# Patient Record
Sex: Female | Born: 1980 | Race: White | Hispanic: No | Smoking: Never smoker
Health system: Southern US, Community
[De-identification: ages and names within clinical notes are randomized; demographics above are authoritative.]

## PROBLEM LIST (undated history)

## (undated) DIAGNOSIS — T7840XA Allergy, unspecified, initial encounter: Secondary | ICD-10-CM

## (undated) DIAGNOSIS — E282 Polycystic ovarian syndrome: Secondary | ICD-10-CM

## (undated) DIAGNOSIS — D649 Anemia, unspecified: Secondary | ICD-10-CM

## (undated) DIAGNOSIS — E119 Type 2 diabetes mellitus without complications: Secondary | ICD-10-CM

## (undated) DIAGNOSIS — F329 Major depressive disorder, single episode, unspecified: Secondary | ICD-10-CM

## (undated) DIAGNOSIS — R011 Cardiac murmur, unspecified: Secondary | ICD-10-CM

## (undated) DIAGNOSIS — F32A Depression, unspecified: Secondary | ICD-10-CM

## (undated) DIAGNOSIS — F419 Anxiety disorder, unspecified: Secondary | ICD-10-CM

## (undated) HISTORY — DX: Cardiac murmur, unspecified: R01.1

## (undated) HISTORY — DX: Polycystic ovarian syndrome: E28.2

## (undated) HISTORY — PX: CERVIX REMOVAL: SHX592

## (undated) HISTORY — DX: Allergy, unspecified, initial encounter: T78.40XA

## (undated) HISTORY — PX: ORIF FOREARM FRACTURE: SHX2124

## (undated) HISTORY — PX: TOTAL ABDOMINAL HYSTERECTOMY W/ BILATERAL SALPINGOOPHORECTOMY: SHX83

## (undated) HISTORY — DX: Anemia, unspecified: D64.9

## (undated) HISTORY — PX: ABDOMINAL HYSTERECTOMY: SHX81

## (undated) HISTORY — PX: TONSILLECTOMY: SUR1361

## (undated) HISTORY — PX: FRACTURE SURGERY: SHX138

---

## 2003-03-23 ENCOUNTER — Inpatient Hospital Stay (HOSPITAL_COMMUNITY): Admission: AD | Admit: 2003-03-23 | Discharge: 2003-03-23 | Payer: Self-pay | Admitting: Obstetrics and Gynecology

## 2003-04-03 ENCOUNTER — Inpatient Hospital Stay (HOSPITAL_COMMUNITY): Admission: AD | Admit: 2003-04-03 | Discharge: 2003-04-06 | Payer: Self-pay | Admitting: Obstetrics and Gynecology

## 2003-04-03 ENCOUNTER — Encounter (INDEPENDENT_AMBULATORY_CARE_PROVIDER_SITE_OTHER): Payer: Self-pay | Admitting: *Deleted

## 2003-05-15 ENCOUNTER — Other Ambulatory Visit: Admission: RE | Admit: 2003-05-15 | Discharge: 2003-05-15 | Payer: Self-pay | Admitting: Obstetrics and Gynecology

## 2004-06-14 ENCOUNTER — Other Ambulatory Visit: Admission: RE | Admit: 2004-06-14 | Discharge: 2004-06-14 | Payer: Self-pay | Admitting: Obstetrics and Gynecology

## 2005-01-14 ENCOUNTER — Inpatient Hospital Stay: Payer: Self-pay | Admitting: Psychiatry

## 2005-06-02 ENCOUNTER — Other Ambulatory Visit: Admission: RE | Admit: 2005-06-02 | Discharge: 2005-06-02 | Payer: Self-pay | Admitting: Obstetrics and Gynecology

## 2007-01-03 ENCOUNTER — Emergency Department (HOSPITAL_COMMUNITY): Admission: EM | Admit: 2007-01-03 | Discharge: 2007-01-03 | Payer: Self-pay | Admitting: Emergency Medicine

## 2010-04-06 ENCOUNTER — Emergency Department (HOSPITAL_COMMUNITY): Admission: EM | Admit: 2010-04-06 | Discharge: 2010-04-06 | Payer: Self-pay | Admitting: Emergency Medicine

## 2010-12-04 LAB — COMPREHENSIVE METABOLIC PANEL
AST: 17 U/L (ref 0–37)
Alkaline Phosphatase: 51 U/L (ref 39–117)
Calcium: 9.5 mg/dL (ref 8.4–10.5)
GFR calc Af Amer: >60 mL/min (ref >60)
GFR calc non Af Amer: >60 mL/min (ref >60)
Glucose, Bld: 90 mg/dL (ref 70–99)
Potassium: 4.2 mEq/L (ref 3.5–5.1)
Total Bilirubin: 1 mg/dL (ref 0.3–1.2)
Total Protein: 7.6 g/dL (ref 6.0–8.3)

## 2010-12-04 LAB — CBC
MCHC: 34.5 g/dL (ref 30.0–36.0)
MCV: 93.1 fL (ref 78.0–100.0)
Platelets: 304 10*3/uL (ref 150–400)
WBC: 6.8 10*3/uL (ref 4.0–10.5)

## 2010-12-04 LAB — WET PREP, GENITAL
Trich, Wet Prep: NONE SEEN
Yeast Wet Prep HPF POC: NONE SEEN

## 2010-12-04 LAB — GLUCOSE, CAPILLARY: Glucose-Capillary: 89 mg/dL (ref 70–99)

## 2010-12-04 LAB — URINALYSIS, ROUTINE W REFLEX MICROSCOPIC
Glucose, UA: NEGATIVE mg/dL
Hgb urine dipstick: NEGATIVE
Ketones, ur: NEGATIVE mg/dL
Nitrite: NEGATIVE

## 2010-12-04 LAB — DIFFERENTIAL
Eosinophils Relative: 2 % (ref 0–5)
Lymphocytes Relative: 39 % (ref 12–46)
Monocytes Absolute: 0.7 10*3/uL (ref 0.1–1.0)
Neutro Abs: 3.3 10*3/uL (ref 1.7–7.7)
Neutrophils Relative %: 49 % (ref 43–77)

## 2010-12-15 ENCOUNTER — Encounter: Payer: PRIVATE HEALTH INSURANCE | Attending: Obstetrics and Gynecology

## 2010-12-15 DIAGNOSIS — O99810 Abnormal glucose complicating pregnancy: Secondary | ICD-10-CM | POA: Insufficient documentation

## 2010-12-15 DIAGNOSIS — Z713 Dietary counseling and surveillance: Secondary | ICD-10-CM | POA: Insufficient documentation

## 2011-02-04 NOTE — Op Note (Signed)
NAME:  PATTIANN, SOLANKI                          ACCOUNT NO.:  [id number]   MEDICAL RECORD NO.:  [id number]                   PATIENT TYPE:  INP   LOCATION:  9108                                 FACILITY:  [facility]   PHYSICIAN:  Guy Sandifer. Arleta Creek, M.D.           DATE OF BIRTH:  05-Nov-1980   DATE OF PROCEDURE:  04/03/2003  DATE OF DISCHARGE:                                 OPERATIVE REPORT   PREOPERATIVE DIAGNOSES:  1. Intrauterine pregnancy at 58 2/7 weeks' estimated gestational age.  2. Arrest of descent.   POSTOPERATIVE DIAGNOSES:  1. Intrauterine pregnancy at 35 2/7 weeks' estimated gestational age.  2. Arrest of descent.  3. Persistent direct occiput posterior.   INDICATIONS AND CONSENT:  This patient is a 30 year old married white  female, G1, P0, with an EDC of April 08, 2003, whose pregnancy has been  complicated by mild pregnancy-induced hypertension.  Group B beta strep  culture is negative.  She is admitted for induction of labor.  Blood  pressures 120s/70s, pH laboratories are within normal limits.  Cervix is 2-3  cm, 50% effaced, and -3 station.  Pitocin augmentation is started followed  by artificial rupture of membranes for clear fluid.  The patient progresses  to complete and pushing and pushes for 1 hour and 10 minutes of good effort.  Vertex remains at the -1 station with caput formation.  There are deep  variables with pushing.  Options are discussed and cesarean section is  pursued.  Potential risks and complications are discussed including, but not  limited to, infection, bowel, bladder, ureteral damage, bleeding requiring  transfusion of blood products with possible transfusion reaction, HIV and  hepatitis acquisition, DVT, PE, pneumonia.  All questions are answered and  consent is signed and on the chart.   PROCEDURE:  The patient was taken to the operating room where her epidural  is augmented.  She is prepped and draped in a sterile fashion.  A Foley  catheter is already in place.  Testing reveals patient is still experiencing  some pain on the left side.  The epidural is augmented.  She is also  injected along the line of the incision with 10 mL of 1% plain Xylocaine.  A  Pfannenstiel incision is made and dissection is carried out in layers to the  peritoneum.  The peritoneum is incised and extended superiorly and  inferiorly.  The vesicouterine peritoneum is taken down cephalolaterally.  The bladder flap is developed and a bladder blade is placed.  The uterus is  incised in a low transverse manner and the uterine cavity is entered bluntly  with a hemostat.  The uterine incision is then extended cephalolaterally  with the fingers.  The vertex is in direct occiput posterior.  In order to  elevate the vertex, a gloved hand from below is used to elevate the head  allowing me to extract the vertex from the  pelvis.  The baby is then  delivered without difficulty.  Oropharynx and nasopharynx are suctioned and  good crying tone is noted.  Cord is clamped and cut and the infant is handed  to the awaiting pediatrics team.  The placenta is manually delivered and  sent to pathology.  The uterine cavity is normal in contour and clean.  The  uterus is closed in a running locking fashion with 0 Monocryl suture.  There  is an extension on each side of the incision approximately 2 cm.  Good  hemostasis is noted.  Tubes and ovaries are normal.  Anterior peritoneum is  closed in running fashion with 0  Monocryl suture which is also used to reapproximate the pyramidalis muscle  in the midline.  The anterior rectus fascia is closed in a running fashion  with 0 PDS suture and the skin is closed with clips.  All sponge,  instrument, and needle counts are correct and the patient is transferred to  the recovery room in stable condition.                                               Guy Sandifer Arleta Creek, M.D.    JET/MEDQ  D:  04/03/2003  T:  04/04/2003   Job:  [id number]

## 2011-02-04 NOTE — Discharge Summary (Signed)
NAME:  Crystal Mccoy, Crystal Mccoy                          ACCOUNT NO.:  [id number]   MEDICAL RECORD NO.:  [id number]                   PATIENT TYPE:  INP   LOCATION:  9108                                 FACILITY:  [facility]   PHYSICIAN:  Dineen Kid. Rana Snare, M.D.                 DATE OF BIRTH:  [birth year]   DATE OF ADMISSION:  04/03/2003  DATE OF DISCHARGE:  04/06/2003                                 DISCHARGE SUMMARY   ADMITTING DIAGNOSES:  1. Intrauterine pregnancy at 65 and two-sevenths weeks estimated gestational     age.  2. Induction of labor due to mild pregnancy-induced hypertension.   DISCHARGE DIAGNOSES:  1. Status post low transverse cesarean section secondary to arrest of     descent and persistent direct occiput posterior.  2. Viable female infant.   PROCEDURE:  Primary low transverse cesarean section.   REASON FOR ADMISSION:  Please see written H&P.   HOSPITAL COURSE:  The patient was a 30 year old primigravida that was  admitted to [facility] for an induction of labor due to  mild pregnancy-induced hypertension.  Blood pressures were 120s over 70s  with PIH labs within normal limits.  Cervix on admission was 2-3 cm dilated,  50% effaced, and vertex at a -3 station.  Pitocin augmentation was started  followed by artificial rupture of membranes.  Fluid was clear.  The patient  did progress to complete dilation and continued to push for approximately  one hour 10 minutes with good effort.  Vertex did remain at a -1 station and  was noted to have caput formation.  There were also deep variable  decelerations noted with pushing.  Despite adequate efforts of pushing and  vertex noted with molding, decision was made to proceed with a primary low  transverse cesarean section.  The patient was then transferred to the  operating room where her epidural was augmented to an adequate surgical  level.  A low transverse incision was made with the delivery of a viable  female  infant weighing 5 pounds 12 ounces with Apgars of 8 at one minute and  9 at five minutes.  Umbilical cord pH was 7.25.  The patient tolerated the  procedure well and was taken to the recovery room in stable condition.  On  postoperative day #1 vital signs were stable, blood pressure 120/70, with  deep tendon reflexes 1-2+ without clonus.  Abdomen was soft with good return  of bowel function and fundus was firm and nontender.  Abdominal dressing was  noted to be clean, dry, and intact.  Foley was noted to be to straight drain  with adequate output.  Labs revealed hemoglobin of 10.2; platelet count of  282,000; wbc count of 13.7.  Liver function tests within normal limits.  On  postoperative day #2 the patient was without complaint.  Vital signs were  stable.  Fundus was firm and  nontender.  Abdominal dressing was removed  revealing an incision that was clean, dry, and intact.  The patient was  tolerating a regular diet without complaints of nausea and vomiting.  She  was ambulating well.  On postoperative day #3 the patient was without  complaint.  She denied headache or visual disturbance.  Vital signs were  stable.  Fundus was firm and nontender.  Incision was clean, dry, and  intact.  Staples were removed and the patient was discharged home.   CONDITION ON DISCHARGE:  Good.   DIET:  Regular as tolerated.   ACTIVITY:  No heavy lifting, no driving x2 weeks, no vaginal entry.   FOLLOW-UP:  The patient is to follow up in the office in one to two weeks  for incision check.  She is to call for temperature greater than 100  degrees, persistent nausea and vomiting, heavy vaginal bleeding, and/or  redness or drainage from the incisional site.   DISCHARGE MEDICATIONS:  1. Percocet 5/325 #30 one p.o. q.4-6h. p.r.n.  2. Motrin 600 mg q.6h. p.r.n.  3. Prenatal vitamins one p.o. daily.  4. Colace one p.o. daily p.r.n.     Julio Sicks, N.P.                        Dineen Kid Rana Snare, M.D.     CC/MEDQ  D:  05/05/2003  T:  05/05/2003  Job:  [id number]

## 2011-02-13 ENCOUNTER — Inpatient Hospital Stay (HOSPITAL_COMMUNITY)
Admission: RE | Admit: 2011-02-13 | Discharge: 2011-02-16 | DRG: 766 | Disposition: A | Discharge status: Home / Self Care | Payer: PRIVATE HEALTH INSURANCE | Source: Ambulatory Visit | Attending: Obstetrics and Gynecology | Admitting: Obstetrics and Gynecology

## 2011-02-13 DIAGNOSIS — O34219 Maternal care for unspecified type scar from previous cesarean delivery: Principal | ICD-10-CM | POA: Diagnosis present

## 2011-02-13 DIAGNOSIS — Z302 Encounter for sterilization: Secondary | ICD-10-CM

## 2011-02-14 ENCOUNTER — Other Ambulatory Visit: Payer: Self-pay | Admitting: Obstetrics and Gynecology

## 2011-02-14 LAB — CBC
HCT: 42.7 % (ref 36.0–46.0)
MCHC: 34.2 g/dL (ref 30.0–36.0)

## 2011-02-15 LAB — CBC
Hemoglobin: 11.5 g/dL — ABNORMAL LOW (ref 12.0–15.0)
MCH: 29.9 pg (ref 26.0–34.0)
MCV: 87 fL (ref 78.0–100.0)
Platelets: 233 10*3/uL (ref 150–400)
RBC: 3.85 MIL/uL — ABNORMAL LOW (ref 3.87–5.11)

## 2011-02-21 NOTE — Discharge Summary (Addendum)
  NAMEINIKA, Crystal Mccoy                ACCOUNT NO.:  [id number]  MEDICAL RECORD NO.:  [id number]  LOCATION:                                 FACILITY:  PHYSICIAN:  Duke Salvia. Marcelle Overlie, M.D.DATE OF BIRTH:  [birth year]  DATE OF ADMISSION:  02/14/2011 DATE OF DISCHARGE:  02/16/2011                              DISCHARGE SUMMARY   ADMITTING DIAGNOSES: 1. Intrauterine pregnancy at 42 weeks' estimated gestational age. 2. Spontaneous onset of labor. 3. Previous cesarean section, desires repeat.  DISCHARGE DIAGNOSES: 1. Status post low transverse cesarean section 2. Viable female infant.  PROCEDURE:  Repeat low transverse cesarean section.  REASON FOR ADMISSION:  Please see written H&P.  HOSPITAL COURSE:  The patient is a 30 year old gravida 2, para 1 that was admitted to [facility] at 37 weeks' estimated gestational age with spontaneous onset of labor.  The patient had had a previous cesarean delivery and desired repeat.  On admission, vital signs were stable.  Contractions of approximately every 2-3 minutes. Fetal heart tones were in the 150s with acceleration.  Cervix was examined and found to be 5 cm dilated.  The patient was then consented for cesarean delivery and was taken to the operating room where spinal anesthesia was administered without difficulty.  A low transverse incision was made with delivery of a viable female infant weighing 5 pounds 4 ounces, Apgar's of 9 at 1 minute and 9 at 5 minutes.  Arterial cord pH of 7.29.  Due to multiparity, the patient also had requested bilateral tubal ligation which was performed without difficulty.  The patient tolerated the procedure well and taken to the recovery room in stable condition.  On postoperative day #1, the patient was without complaint.  Vital signs were stable.  Fundus firm and nontender. Abdominal dressing was noted to be clean, dry, and intact.  On postoperative day #2, the patient desired early discharge.   Vital signs were stable.  Abdomen soft.  Fundus firm and nontender.  Incision was clean, dry, and intact.  Discharge instructions were reviewed and the patient was later discharged home.  CONDITION ON DISCHARGE:  Stable.  DIET:  Regular as tolerated.  ACTIVITY:  No heavy lifting, no driving x2 weeks, no vaginal entry.  FOLLOWUP:  The patient to follow up in the office in 2 days for staple removal.  She is to call for temperature greater than 100 degrees, persistent nausea, vomiting, heavy vaginal bleeding, and/or redness, drainage from incisional site.  DISCHARGE MEDICATIONS: 1. Tylox #30, 1 p.o. every 4-6 hours p.r.n. 2. Motrin 600 mg every 6 hours. 3. Prenatal vitamins 1 p.o. daily.     Julio Sicks, N.P.   ______________________________ Duke Salvia. Marcelle Overlie, M.D.    CC/MEDQ  D:  02/16/2011  T:  02/16/2011  Job:  [id number]  Electronically Signed by Julio Sicks N.P. on 03/11/2011 08:33:55 AM Electronically Signed by Richarda Overlie M.D. on 03/15/2011 01:40:49 PM

## 2011-02-22 ENCOUNTER — Other Ambulatory Visit (HOSPITAL_COMMUNITY): Payer: PRIVATE HEALTH INSURANCE

## 2011-02-23 NOTE — Op Note (Signed)
Crystal Mccoy, Crystal Mccoy                ACCOUNT NO.:  [id number]  MEDICAL RECORD NO.:  [id number]           PATIENT TYPE:  LOCATION:                                 FACILITY:  PHYSICIAN:  Zelphia Cairo, MD    DATE OF BIRTH:  May 09, 1981  DATE OF PROCEDURE:  02/14/2011 DATE OF DISCHARGE:                              OPERATIVE REPORT   PREOPERATIVE DIAGNOSES: 1. Intrauterine pregnancy at 37 weeks. 2. Prior cesarean section, desires repeat. 3. Labor. 4. Desires permanent sterilization.  PROCEDURES: 1. Repeat low transverse cesarean delivery. 2. Bilateral partial salpingectomy.  SURGEON:  Zelphia Cairo, MD  ANESTHESIA:  Spinal.  FINDINGS:  Viable female infant with Apgar's of 9 and 9, pH 7.29, weight 5 pounds 4 ounces, placenta to path.  SPECIMEN:  Placenta to Pathology.  URINE OUTPUT:  200 mL, clear.  ESTIMATED BLOOD LOSS:  500 mL.  COMPLICATIONS:  None.  CONDITION:  Stable to recovery room.  PROCEDURE:  [name] was taken to the operating room where spinal anesthesia was found to be adequate.  She was placed in the supine position with a left tilt, prepped and draped in sterile fashion and a Foley catheter was inserted sterilely.  Pfannenstiel skin incision was made with a scalpel and extended laterally using curved Mayo scissors. Kocher clamps were used to grasp the superior and inferior portion of the fascia.  The fascia was tented upward and underlying rectus muscles were dissected off using sharp and blunt dissection and the Bovie. Peritoneum was then identified and tented upwards.  This was entered sharply with Metzenbaum scissors and extended superiorly and inferiorly with good visualization of the bladder.  Bladder blade was then inserted and the vesicouterine peritoneum was dissected off the lower uterine segment.  Bladder blade was repositioned.  Uterine incision was made with a scalpel and extended bluntly using my fingers.  Membranes were ruptured and the  fetus was delivered using fundal pressure.  The mouth and nose were suctioned as the cord was clamped and cut and infant was taken to the awaiting pediatric staff.  Placenta was then manually removed from the uterus.  The uterus was cleared of all clots and debris using a dry lap sponge.  The uterine incision was reapproximated using double layer closure of 0 chromic in a running locked fashion.  Once hemostasis was assured, our attention was turned to the fallopian tubes.  A Babcock clamp was used to grasp the left fallopian tube.  Mesosalpinx was transected below the Babcock clamp and a knuckle of fallopian tube was doubly ligated using plain gut suture.  Segment of fallopian tube was then transected and passed off to be sent to Pathology.  Hemostasis was assured and the procedure was repeated on the opposite fallopian tube.  Once bilateral salpingectomy had been performed, the uterine incision was reinspected and again found to be hemostatic.  The pelvis was then copiously irrigated with saline and the peritoneum was closed with 0 Monocryl.  The fascia was closed with a looped 0 PDS. Subcutaneous tissue was reapproximated with plain gut suture and the skin was closed with staples.  Sponge lap,  needle, and instrument counts were correct x2.     Zelphia Cairo, MD     GA/MEDQ  D:  02/14/2011  T:  02/14/2011  Job:  [id number]  Electronically Signed by Zelphia Cairo MD on 02/23/2011 07:41:39 PM

## 2012-10-22 ENCOUNTER — Emergency Department (HOSPITAL_COMMUNITY)
Admission: EM | Admit: 2012-10-22 | Discharge: 2012-10-22 | Disposition: A | Discharge status: Home / Self Care | Payer: PRIVATE HEALTH INSURANCE | Attending: Emergency Medicine | Admitting: Emergency Medicine

## 2012-10-22 ENCOUNTER — Encounter (HOSPITAL_COMMUNITY): Payer: Self-pay | Admitting: *Deleted

## 2012-10-22 DIAGNOSIS — R112 Nausea with vomiting, unspecified: Secondary | ICD-10-CM | POA: Insufficient documentation

## 2012-10-22 DIAGNOSIS — R109 Unspecified abdominal pain: Secondary | ICD-10-CM

## 2012-10-22 DIAGNOSIS — Z9889 Other specified postprocedural states: Secondary | ICD-10-CM | POA: Insufficient documentation

## 2012-10-22 DIAGNOSIS — R1013 Epigastric pain: Secondary | ICD-10-CM | POA: Insufficient documentation

## 2012-10-22 LAB — URINALYSIS, ROUTINE W REFLEX MICROSCOPIC
Glucose, UA: NEGATIVE mg/dL
Hgb urine dipstick: NEGATIVE
Ketones, ur: 15 mg/dL — AB
Nitrite: NEGATIVE
Urobilinogen, UA: 0.2 mg/dL (ref 0.0–1.0)
pH: 5.5 (ref 5.0–8.0)

## 2012-10-22 LAB — COMPREHENSIVE METABOLIC PANEL
ALT: 7 U/L (ref 0–35)
Albumin: 4.2 g/dL (ref 3.5–5.2)
Calcium: 9.5 mg/dL (ref 8.4–10.5)
Creatinine, Ser: 0.55 mg/dL (ref 0.50–1.10)
GFR calc Af Amer: >90 mL/min (ref >90)
GFR calc non Af Amer: >90 mL/min (ref >90)
Glucose, Bld: 153 mg/dL — ABNORMAL HIGH (ref 70–99)
Potassium: 3.9 mEq/L (ref 3.5–5.1)
Sodium: 136 mEq/L (ref 135–145)
Total Bilirubin: 0.7 mg/dL (ref 0.3–1.2)
Total Protein: 8.3 g/dL (ref 6.0–8.3)

## 2012-10-22 LAB — URINE MICROSCOPIC-ADD ON

## 2012-10-22 LAB — CBC
HCT: 42.1 % (ref 36.0–46.0)
WBC: 11.1 10*3/uL — ABNORMAL HIGH (ref 4.0–10.5)

## 2012-10-22 MED ORDER — ONDANSETRON HCL 4 MG/2ML IJ SOLN
4.0000 mg | Freq: Once | INTRAMUSCULAR | Status: AC
  Administered 2012-10-22: 4 mg via INTRAVENOUS
  Filled 2012-10-22: qty 2

## 2012-10-22 MED ORDER — PROMETHAZINE HCL 25 MG PO TABS: 12.5000 mg | ORAL_TABLET | Freq: Four times a day (QID) | ORAL | Status: DC | PRN

## 2012-10-22 MED ORDER — PROMETHAZINE HCL 25 MG/ML IJ SOLN
25.0000 mg | Freq: Once | INTRAMUSCULAR | Status: AC
  Administered 2012-10-22: 25 mg via INTRAVENOUS
  Filled 2012-10-22: qty 1

## 2012-10-22 MED ORDER — SODIUM CHLORIDE 0.9 % IV SOLN
Freq: Once | INTRAVENOUS | Status: AC
  Administered 2012-10-22: 06:00:00 via INTRAVENOUS

## 2012-10-22 NOTE — ED Notes (Signed)
Pt reporting epigastric abdominal pain starting about 11 pm.  Also reporting nausea and vomiting beginning at about 1.  Pt reports taking phenergan and zofran at home with no relief.  Also complaining of rash on face that also began this evening.

## 2012-10-22 NOTE — ED Provider Notes (Signed)
History     CSN: [id number]  Arrival date & time 10/22/12  0503   First MD Initiated Contact with Patient 10/22/12 [phone]      Chief Complaint  Patient presents with  . Abdominal Pain  . Nausea  . Emesis    (Consider location/radiation/quality/duration/timing/severity/associated sxs/prior treatment) Patient is a 32 y.o. female presenting with abdominal pain and vomiting.  Abdominal Pain The primary symptoms of the illness include abdominal pain, nausea and vomiting. The primary symptoms of the illness do not include fever or shortness of breath.  Symptoms associated with the illness do not include back pain.  Emesis  Associated symptoms include abdominal pain. Pertinent negatives include no cough, no fever and no headaches.  YOVANNA COGAN is a 32 y.o. female who presents to the Emergency Department complaining of abdominal pain associated with vomiting. She woke originally around 2300 and took phenergan. Woke again at 0100 with vomiting and abdominal pain. Denies fever, chills. Has abdominal pain. Vomiting.  History reviewed. No pertinent past medical history.  Past Surgical History  Procedure Date  . Cesarean section     History reviewed. No pertinent family history.  History  Substance Use Topics  . Smoking status: Not on file  . Smokeless tobacco: Not on file  . Alcohol Use: No    OB History    Grav Para Term Preterm Abortions TAB SAB Ect Mult Living                  Review of Systems  Constitutional: Negative for fever.       10 Systems reviewed and are negative for acute change except as noted in the HPI.  HENT: Negative for congestion.   Eyes: Negative for discharge and redness.  Respiratory: Negative for cough and shortness of breath.   Cardiovascular: Negative for chest pain.  Gastrointestinal: Positive for nausea, vomiting and abdominal pain.  Musculoskeletal: Negative for back pain.  Skin: Negative for rash.  Neurological: Negative for syncope,  numbness and headaches.  Psychiatric/Behavioral:       No behavior change.    Allergies  Omniflox and Penicillins  Home Medications   Current Outpatient Rx  Name  Route  Sig  Dispense  Refill  . ETONOGESTREL-ETHINYL ESTRADIOL 0.12-0.015 MG/24HR VA RING   Vaginal   Place 1 each vaginally every 28 (twenty-eight) days. Insert vaginally and leave in place for 3 consecutive weeks, then remove for 1 week.           BP 150/88  Pulse 131  Temp 98.9 F (37.2 C) (Oral)  Resp 18  Ht 5\' 5"  (1.651 m)  Wt 210 lb (95.255 kg)  BMI 34.95 kg/m2  SpO2 97%  LMP 09/28/2012  Physical Exam  Nursing note and vitals reviewed. Constitutional:       Awake, alert, nontoxic appearance.  HENT:  Head: Atraumatic.  Eyes: Right eye exhibits no discharge. Left eye exhibits no discharge.  Neck: Neck supple.  Cardiovascular: Normal heart sounds.   Pulmonary/Chest: Effort normal and breath sounds normal. She exhibits no tenderness.  Abdominal: Soft. There is tenderness. There is no rebound.       vomiting  Musculoskeletal: She exhibits no tenderness.       Baseline ROM, no obvious new focal weakness.  Neurological:       Mental status and motor strength appears baseline for patient and situation.  Skin: No rash noted.  Psychiatric: She has a normal mood and affect.    ED Course  Procedures (  including critical care time)  Results for orders placed during the hospital encounter of 10/22/12  URINALYSIS, ROUTINE W REFLEX MICROSCOPIC      Component Value Range   Color, Urine YELLOW  YELLOW   APPearance CLEAR  CLEAR   Specific Gravity, Urine >1.030 (*) 1.005 - 1.030   pH 5.5  5.0 - 8.0   Glucose, UA NEGATIVE  NEGATIVE mg/dL   Hgb urine dipstick NEGATIVE  NEGATIVE   Bilirubin Urine NEGATIVE  NEGATIVE   Ketones, ur 15 (*) NEGATIVE mg/dL   Protein, ur TRACE (*) NEGATIVE mg/dL   Urobilinogen, UA 0.2  0.0 - 1.0 mg/dL   Nitrite NEGATIVE  NEGATIVE   Leukocytes, UA NEGATIVE  NEGATIVE  CBC       Component Value Range   WBC 11.1 (*) 4.0 - 10.5 K/uL   RBC 4.87  3.87 - 5.11 MIL/uL   Hemoglobin 14.9  12.0 - 15.0 g/dL   HCT 78.4  69.6 - 29.5 %   MCV 86.4  78.0 - 100.0 fL   MCH 30.6  26.0 - 34.0 pg   MCHC 35.4  30.0 - 36.0 g/dL   RDW 28.4  13.2 - 44.0 %   Platelets 312  150 - 400 K/uL  COMPREHENSIVE METABOLIC PANEL      Component Value Range   Sodium 136  135 - 145 mEq/L   Potassium 3.9  3.5 - 5.1 mEq/L   Chloride 103  96 - 112 mEq/L   CO2 18 (*) 19 - 32 mEq/L   Glucose, Bld 153 (*) 70 - 99 mg/dL   BUN 12  6 - 23 mg/dL   Creatinine, Ser 1.02  0.50 - 1.10 mg/dL   Calcium 9.5  8.4 - 72.5 mg/dL   Total Protein 8.3  6.0 - 8.3 g/dL   Albumin 4.2  3.5 - 5.2 g/dL   AST 17  0 - 37 U/L   ALT 7  0 - 35 U/L   Alkaline Phosphatase 56  39 - 117 U/L   Total Bilirubin 0.7  0.3 - 1.2 mg/dL   GFR calc non Af Amer >90  >90 mL/min   GFR calc Af Amer >90  >90 mL/min  URINE MICROSCOPIC-ADD ON      Component Value Range   Squamous Epithelial / LPF FEW (*) RARE   RBC / HPF 0-2  <3 RBC/hpf   Bacteria, UA FEW (*) RARE     MDM  Patient with abdominal pain, vomiting, nausea.Given zofran with some relief. No further vomiting. Continues to have abdominal pain. Given phenergan. Labs are unremarkable. Continued to have pain in the abdomen. Pt stable in ED with no significant deterioration in condition.The patient appears reasonably screened and/or stabilized for discharge and I doubt any other medical condition or other [facility] requiring further screening, evaluation, or treatment in the ED at this time prior to discharge. MDM Reviewed: nursing note and vitals Interpretation: labs           Nicoletta Dress. Colon Branch, MD 10/22/12 [phone]

## 2014-06-27 ENCOUNTER — Other Ambulatory Visit: Payer: Self-pay | Admitting: Obstetrics and Gynecology

## 2014-10-23 ENCOUNTER — Ambulatory Visit: Payer: Self-pay | Admitting: General Practice

## 2016-08-19 ENCOUNTER — Encounter (HOSPITAL_COMMUNITY): Payer: Self-pay

## 2016-08-19 ENCOUNTER — Emergency Department (HOSPITAL_COMMUNITY)
Admission: EM | Admit: 2016-08-19 | Discharge: 2016-08-19 | Disposition: A | Discharge status: Home / Self Care | Payer: BLUE CROSS/BLUE SHIELD | Attending: Emergency Medicine | Admitting: Emergency Medicine

## 2016-08-19 DIAGNOSIS — F410 Panic disorder [episodic paroxysmal anxiety] without agoraphobia: Secondary | ICD-10-CM

## 2016-08-19 DIAGNOSIS — Z91040 Latex allergy status: Secondary | ICD-10-CM | POA: Insufficient documentation

## 2016-08-19 DIAGNOSIS — F419 Anxiety disorder, unspecified: Secondary | ICD-10-CM | POA: Diagnosis not present

## 2016-08-19 MED ORDER — HYDROXYZINE HCL 25 MG PO TABS: 25.0000 mg | ORAL_TABLET | Freq: Three times a day (TID) | ORAL | 0 refills | Status: DC | PRN

## 2016-08-19 NOTE — ED Notes (Signed)
Pt is in stable condition upon d/c and ambulates from ED. 

## 2016-08-19 NOTE — ED Provider Notes (Signed)
[facility]-EMERGENCY DEPT Provider Note   CSN: [id number] Arrival date & time: 08/19/16  [id number]     History   Chief Complaint Chief Complaint  Patient presents with  . Anxiety    HPI Crystal Mccoy is a 35 y.o. female.  HPI Patient presents with episode of anxiety. States she was driving to work and then later found herself 30 miles away not sure where she was. Does have a history of anxiety and has had a stressor lice recently but states she has never had an episode this severe in the past. No headache. No chest pain or trouble breathing. She called her husband, from whom she is separated. She took an Atarax and is feeling somewhat better now. No numbness or weakness. Denies drug use. Denies possibly pregnancy. Denies suicidal or homicidal thoughts. She does have some she sees about her anxiety.   History reviewed. No pertinent past medical history.  There are no active problems to display for this patient.   Past Surgical History:  Procedure Laterality Date  . CESAREAN SECTION      OB History    No data available       Home Medications    Prior to Admission medications   Medication Sig Start Date End Date Taking? Authorizing Provider  CAMRESE 0.15-0.03 &0.01 MG tablet Take 1 tablet by mouth every evening. 06/27/16  Yes Historical Provider, MD  eszopiclone (LUNESTA) 2 MG TABS tablet Take 2 mg by mouth at bedtime. 08/18/16  Yes Historical Provider, MD  loratadine (CLARITIN) 10 MG tablet Take 10 mg by mouth daily.   Yes Historical Provider, MD  PARoxetine (PAXIL) 10 MG tablet Take 10 mg by mouth every evening. 06/27/16  Yes Historical Provider, MD  hydrOXYzine (ATARAX/VISTARIL) 25 MG tablet Take 1 tablet (25 mg total) by mouth every 8 (eight) hours as needed for anxiety. 08/19/16   Benjiman CoreNathan Kateena Degroote, MD  promethazine (PHENERGAN) 25 MG tablet Take 0.5 tablets (12.5 mg total) by mouth every 6 (six) hours as needed for nausea. Patient not taking: Reported on 08/19/2016 10/22/12 10/29/12   Annamarie Dawleyerry S Strand, MD    Family History No family history on file.  Social History Social History  Substance Use Topics  . Smoking status: Never Smoker  . Smokeless tobacco: Never Used  . Alcohol use No     Allergies   Omniflox [temafloxacin]; Latex; and Penicillins   Review of Systems Review of Systems  Constitutional: Negative for activity change and appetite change.  Eyes: Negative for pain.  Respiratory: Negative for chest tightness and shortness of breath.   Cardiovascular: Negative for chest pain and leg swelling.  Gastrointestinal: Negative for abdominal pain, diarrhea, nausea and vomiting.  Genitourinary: Negative for flank pain.  Musculoskeletal: Negative for back pain.  Skin: Negative for rash.  Neurological: Negative for speech difficulty, weakness, light-headedness, numbness and headaches.  Psychiatric/Behavioral: Negative for behavioral problems. The patient is nervous/anxious.      Physical Exam Updated Vital Signs BP 147/79   Pulse 70   Temp 97.9 F (36.6 C) (Oral)   Resp 16   Ht 5\' 6"  (1.676 m)   Wt 210 lb (95.3 kg)   LMP 08/19/2016   SpO2 97%   BMI 33.89 kg/m   Physical Exam  Constitutional: She is oriented to person, place, and time. She appears well-developed.  HENT:  Head: Atraumatic.  Eyes: EOM are normal.  Neck: Neck supple.  Cardiovascular: Normal rate.   Pulmonary/Chest: Effort normal.  Abdominal: Soft.  Musculoskeletal: She  exhibits no edema.  Neurological: She is alert and oriented to person, place, and time.  Skin: Skin is warm. Capillary refill takes less than 2 seconds.  Psychiatric:  Patient appears somewhat anxious. Awake and appropriate otherwise.     ED Treatments / Results  Labs (all labs ordered are listed, but only abnormal results are displayed) Labs Reviewed - No data to display  EKG  EKG Interpretation None       Radiology No results found.  Procedures Procedures (including critical care  time)  Medications Ordered in ED Medications - No data to display   Initial Impression / Assessment and Plan / ED Course  I have reviewed the triage vital signs and the nursing notes.  Pertinent labs & imaging results that were available during my care of the patient were reviewed by me and considered in my medical decision making (see chart for details).  Clinical Course     Patient with likely episode of anxiety. Feels better after her outpatient Atarax. Nonfocal exam. Has follow-up. Will discharge with more of her Atarax.  Final Clinical Impressions(s) / ED Diagnoses   Final diagnoses:  Anxiety attack    New Prescriptions New Prescriptions   HYDROXYZINE (ATARAX/VISTARIL) 25 MG TABLET    Take 1 tablet (25 mg total) by mouth every 8 (eight) hours as needed for anxiety.     Benjiman CoreNathan Kalandra Masters, MD 08/19/16 [phone]

## 2016-08-19 NOTE — ED Triage Notes (Signed)
Pt. Woke up this morning was getting ready for work , left the house and 30 minutes later called her husband and having a panic attack and did not know where she was at or how she got there.  She called her husband., and he went and picked her up and gave her a adderax.  Pt. Is alert and oriented X4 and pt. Feels very anxious.  She reports this has happened one other time. Pt. Denies any chest pain or sob.,  No neuro deficits noted.

## 2018-02-05 ENCOUNTER — Ambulatory Visit: Payer: Self-pay | Admitting: Family Medicine

## 2018-02-05 VITALS — BP 142/81 | HR 82 | Temp 97.9°F | Resp 18 | Ht 65.0 in | Wt 209.6 lb

## 2018-02-05 DIAGNOSIS — Z299 Encounter for prophylactic measures, unspecified: Secondary | ICD-10-CM

## 2018-02-05 NOTE — Progress Notes (Signed)
Subjective: Pre-nursing school exam  Patient presents to for a pre-nursing school physical exam with immunizations.  Patient reports that her only medical history is type 2 diabetes, PCOS, and anxiety and depression.  Patient reports she uses Victoza for type 2 diabetes, which is well controlled without any diabetic complications or hypoglycemia.  Patient reports her A1c is usually around 5.2%.  Patient has been on desvenlafaxine for anxiety and depression for years and reports that this is very well controlled.  Patient also takes Atarax at night on occasion as needed for insomnia.  Patient does not take this during the day and reports this does not cause any daytime drowsiness.  Patient denies any other history or limitations.  Patient denies any cardiac or abnormal symptoms with physical exertion.  Denies any relevant family history.  No history of adverse reactions to vaccinations. Patient denies any other issues or concerns.   Review of Systems Unremarkable  Objective  Physical Exam General: Awake, alert and oriented. No acute distress. Well developed, hydrated and nourished. Appears stated age.  HEENT: Supple neck without adenopathy. Sclera is non-icteric. The ear canal is clear without discharge. The tympanic membrane is normal in appearance with normal landmarks and cone of light. Nasal mucosa is pink and moist. Oral mucosa is pink and moist. The pharynx is normal in appearance without tonsillar swelling or exudates.  Skin: Skin in warm, dry and intact without rashes or lesions. Appropriate color for ethnicity. Cardiac: Heart rate and rhythm are normal. No murmurs, gallops, or rubs are auscultated.  Respiratory: The chest wall is symmetric and without deformity. No signs of respiratory distress. Lung sounds are clear in all lobes bilaterally without rales, ronchi, or wheezes.  Neurological: The patient is awake, alert and oriented to person, place, and time with normal speech.  Memory is normal  and thought processes intact. No gait abnormalities are appreciated.  Grossly normal. Psychiatric: Appropriate mood and affect.  Back: symmetric, no curvature. ROM normal.  Abdomen: soft, non-tender; bowel sounds normal; no masses,  no organomegaly Extremities: extremities normal, atraumatic, no cyanosis or edema Pulses: 2+ and symmetric  Assessment Pre-nursing school physical exam  Plan  Encouraged routine visits with primary care provider.  Varicella titer drawn today. Tdap given today, patient unsure when this was last given. Patient denies any history of hepatitis B immunizations in the past, first dose given today.  Patient will need to return for 2 additional doses. PPD performed today. Patient presented evidence of 2 doses of MMR and completed polio vaccination series. Patient's blood pressure is 142/81 today.  Patient reports this is due to coming to the clinic.  Advised patient to check regularly and follow-up with primary care provider. Not providing clearance for latex allergy.  Advised patient this needs from her primary care provider or allergist, who would be more appropriate to provide recommendations for treatment.

## 2018-02-06 LAB — VARICELLA ZOSTER ANTIBODY, IGG: VARICELLA: 2610 {index} (ref >165)

## 2018-03-08 ENCOUNTER — Other Ambulatory Visit: Payer: Self-pay

## 2018-03-08 DIAGNOSIS — Z0000 Encounter for general adult medical examination without abnormal findings: Secondary | ICD-10-CM

## 2018-07-11 ENCOUNTER — Emergency Department (HOSPITAL_COMMUNITY)
Admission: EM | Admit: 2018-07-11 | Discharge: 2018-07-11 | Disposition: A | Discharge status: Home / Self Care | Payer: Managed Care, Other (non HMO) | Attending: Emergency Medicine | Admitting: Emergency Medicine

## 2018-07-11 ENCOUNTER — Other Ambulatory Visit: Payer: Self-pay

## 2018-07-11 ENCOUNTER — Encounter (HOSPITAL_COMMUNITY): Payer: Self-pay | Admitting: Emergency Medicine

## 2018-07-11 DIAGNOSIS — W272XXA Contact with scissors, initial encounter: Secondary | ICD-10-CM | POA: Insufficient documentation

## 2018-07-11 DIAGNOSIS — E119 Type 2 diabetes mellitus without complications: Secondary | ICD-10-CM | POA: Diagnosis not present

## 2018-07-11 DIAGNOSIS — Z91040 Latex allergy status: Secondary | ICD-10-CM | POA: Insufficient documentation

## 2018-07-11 DIAGNOSIS — S61211A Laceration without foreign body of left index finger without damage to nail, initial encounter: Secondary | ICD-10-CM | POA: Insufficient documentation

## 2018-07-11 DIAGNOSIS — Z79899 Other long term (current) drug therapy: Secondary | ICD-10-CM | POA: Diagnosis not present

## 2018-07-11 DIAGNOSIS — Y939 Activity, unspecified: Secondary | ICD-10-CM | POA: Insufficient documentation

## 2018-07-11 DIAGNOSIS — Y999 Unspecified external cause status: Secondary | ICD-10-CM | POA: Insufficient documentation

## 2018-07-11 DIAGNOSIS — Y929 Unspecified place or not applicable: Secondary | ICD-10-CM | POA: Diagnosis not present

## 2018-07-11 HISTORY — DX: Depression, unspecified: F32.A

## 2018-07-11 HISTORY — DX: Anxiety disorder, unspecified: F41.9

## 2018-07-11 HISTORY — DX: Major depressive disorder, single episode, unspecified: F32.9

## 2018-07-11 HISTORY — DX: Type 2 diabetes mellitus without complications: E11.9

## 2018-07-11 MED ORDER — POVIDONE-IODINE 10 % EX SOLN
CUTANEOUS | Status: AC
  Filled 2018-07-11: qty 15

## 2018-07-11 NOTE — Discharge Instructions (Signed)
Keep your wound clean and dry as discussed.  Get rechecked for any sign of infection (redness,  Swelling,  Increased pain or drainage of purulent fluid).  Allow the dermabond to fall away which it will do as the wound heals.

## 2018-07-11 NOTE — ED Provider Notes (Signed)
[facility] EMERGENCY DEPARTMENT Provider Note   CSN: [id number] Arrival date & time: 07/11/18  1647     History   Chief Complaint Chief Complaint  Patient presents with  . Laceration    HPI Crystal Mccoy is a 37 y.o. female.  The history is provided by the patient.  Laceration   The incident occurred 3 to 5 hours ago. The laceration is located on the left hand. Size: 0.5 cm. Injury mechanism: a clean scissors. The pain is at a severity of 0/10. The patient is experiencing no pain. She reports no foreign bodies present. Her tetanus status is UTD.    Past Medical History:  Diagnosis Date  . Anxiety   . Depression   . Diabetes mellitus without complication (HCC)     There are no active problems to display for this patient.   Past Surgical History:  Procedure Laterality Date  . CESAREAN SECTION    . TONSILLECTOMY       OB History    Gravida  2   Para  2   Term  2   Preterm      AB      Living        SAB      TAB      Ectopic      Multiple      Live Births               Home Medications    Prior to Admission medications   Medication Sig Start Date End Date Taking? Authorizing Provider  desvenlafaxine (PRISTIQ) 50 MG 24 hr tablet Take 50 mg by mouth daily.    [provider]  hydrOXYzine (ATARAX/VISTARIL) 25 MG tablet Take 1 tablet (25 mg total) by mouth every 8 (eight) hours as needed for anxiety. 08/19/16   Benjiman Core, MD  Inositol-D Chiro-Inositol (OVASITOL PO) Take by mouth.    [provider]  liraglutide (VICTOZA) 18 MG/3ML SOPN Inject 1.8 mg into the skin.    [provider]  phentermine 37.5 MG capsule Take 37.5 mg by mouth every morning.    [provider]  Vitamin D, Ergocalciferol, (DRISDOL) 50000 units CAPS capsule Take 50,000 Units by mouth every 7 (seven) days.    [provider]    Family History Family History  Problem Relation Age of Onset  . Hypertension Father   .  Stroke Father   . Cancer - Other Father   . Seizures Father   . Diabetes Father     Social History Social History   Tobacco Use  . Smoking status: Never Smoker  . Smokeless tobacco: Never Used  Substance Use Topics  . Alcohol use: Yes  . Drug use: No     Allergies   Omniflox [temafloxacin]; Latex; and Penicillins   Review of Systems Review of Systems  Constitutional: Negative for chills and fever.  Respiratory: Negative for shortness of breath and wheezing.   Musculoskeletal: Negative for arthralgias.  Skin: Positive for wound.  Neurological: Negative for weakness and numbness.     Physical Exam Updated Vital Signs BP (!) 149/88 (BP Location: Right Arm)   Pulse 78   Temp 98 F (36.7 C) (Oral)   Resp 16   Ht 5\' 5"  (1.651 m)   Wt 94.3 kg   LMP 06/27/2018   SpO2 99%   BMI 34.61 kg/m   Physical Exam  Constitutional: She is oriented to person, place, and time. She appears well-developed and well-nourished.  HENT:  Head: Normocephalic.  Cardiovascular: Normal rate.  Pulmonary/Chest: Effort normal.  Musculoskeletal: She exhibits no tenderness.  Neurological: She is alert and oriented to person, place, and time. No sensory deficit.  Skin: Laceration noted.  0.5 cm laceration left distal index finger, approximated and hemostatic.     ED Treatments / Results  Labs (all labs ordered are listed, but only abnormal results are displayed) Labs Reviewed - No data to display  EKG None  Radiology No results found.  Procedures Procedures (including critical care time)  LACERATION REPAIR Performed by: Burgess Amor Authorized by: Burgess Amor Consent: Verbal consent obtained. Risks and benefits: risks, benefits and alternatives were discussed Consent given by: patient Patient identity confirmed: provided demographic data Prepped and Draped in normal sterile fashion Wound explored  Laceration Location: left distal index finger  Laceration Length: 0.5  cm  No Foreign Bodies seen or palpated  Anesthesia: n/a Local anesthetic: n/a Anesthetic total: none  Irrigation method: wound soaked in betadine and saline wash.  Amount of cleaning: standard  Skin closure: dermabond  Number of sutures: dermabdon  Technique: dermabond  Patient tolerance: Patient tolerated the procedure well with no immediate complications.   Medications Ordered in ED Medications  povidone-iodine (BETADINE) 10 % external solution (has no administration in time range)     Initial Impression / Assessment and Plan / ED Course  I have reviewed the triage vital signs and the nursing notes.  Pertinent labs & imaging results that were available during my care of the patient were reviewed by me and considered in my medical decision making (see chart for details).     dermabond wound care instructions given.  Prn f/u anticipated.    Final Clinical Impressions(s) / ED Diagnoses   Final diagnoses:  Laceration of left index finger without foreign body without damage to nail, initial encounter    ED Discharge Orders    None       Victoriano Lain 07/11/18 1748    Terrilee Files, MD 07/12/18 [phone]

## 2018-07-11 NOTE — ED Triage Notes (Signed)
Patient with laceration to her L index finger.

## 2018-07-11 NOTE — ED Notes (Signed)
Pt with small lac to tip of left index finger that occurred with scissors around 1500/1530 this afternoon.  Last tetanus back in June this year.

## 2018-08-08 ENCOUNTER — Other Ambulatory Visit: Payer: Self-pay

## 2018-08-13 ENCOUNTER — Other Ambulatory Visit: Payer: Self-pay

## 2018-08-21 ENCOUNTER — Ambulatory Visit: Payer: Self-pay

## 2018-08-21 DIAGNOSIS — Z23 Encounter for immunization: Secondary | ICD-10-CM

## 2018-09-27 ENCOUNTER — Other Ambulatory Visit: Payer: Self-pay

## 2018-09-27 ENCOUNTER — Observation Stay (HOSPITAL_COMMUNITY)
Admission: EM | Admit: 2018-09-27 | Discharge: 2018-10-01 | Disposition: A | Discharge status: Home / Self Care | Payer: Managed Care, Other (non HMO) | Attending: Emergency Medicine | Admitting: Emergency Medicine

## 2018-09-27 ENCOUNTER — Encounter (HOSPITAL_COMMUNITY): Payer: Self-pay

## 2018-09-27 ENCOUNTER — Emergency Department (HOSPITAL_COMMUNITY): Payer: Managed Care, Other (non HMO)

## 2018-09-27 DIAGNOSIS — M25551 Pain in right hip: Secondary | ICD-10-CM | POA: Insufficient documentation

## 2018-09-27 DIAGNOSIS — M791 Myalgia, unspecified site: Secondary | ICD-10-CM | POA: Insufficient documentation

## 2018-09-27 DIAGNOSIS — R52 Pain, unspecified: Secondary | ICD-10-CM

## 2018-09-27 DIAGNOSIS — Z7984 Long term (current) use of oral hypoglycemic drugs: Secondary | ICD-10-CM | POA: Insufficient documentation

## 2018-09-27 DIAGNOSIS — R509 Fever, unspecified: Secondary | ICD-10-CM | POA: Diagnosis present

## 2018-09-27 DIAGNOSIS — Z79899 Other long term (current) drug therapy: Secondary | ICD-10-CM | POA: Diagnosis not present

## 2018-09-27 DIAGNOSIS — R6510 Systemic inflammatory response syndrome (SIRS) of non-infectious origin without acute organ dysfunction: Principal | ICD-10-CM | POA: Insufficient documentation

## 2018-09-27 DIAGNOSIS — M25451 Effusion, right hip: Secondary | ICD-10-CM

## 2018-09-27 DIAGNOSIS — E119 Type 2 diabetes mellitus without complications: Secondary | ICD-10-CM | POA: Insufficient documentation

## 2018-09-27 DIAGNOSIS — Z880 Allergy status to penicillin: Secondary | ICD-10-CM | POA: Insufficient documentation

## 2018-09-27 DIAGNOSIS — M2550 Pain in unspecified joint: Secondary | ICD-10-CM

## 2018-09-27 DIAGNOSIS — Z91040 Latex allergy status: Secondary | ICD-10-CM | POA: Insufficient documentation

## 2018-09-27 DIAGNOSIS — M25559 Pain in unspecified hip: Secondary | ICD-10-CM | POA: Diagnosis present

## 2018-09-27 LAB — COMPREHENSIVE METABOLIC PANEL
ALBUMIN: 3.9 g/dL (ref 3.5–5.0)
ALK PHOS: 88 U/L (ref 38–126)
ALT: 30 U/L (ref 0–44)
AST: 41 U/L (ref 15–41)
Anion gap: 12 (ref 5–15)
BUN: 10 mg/dL (ref 6–20)
CALCIUM: 9.1 mg/dL (ref 8.9–10.3)
CO2: 22 mmol/L (ref 22–32)
CREATININE: 0.69 mg/dL (ref 0.44–1.00)
Chloride: 97 mmol/L — ABNORMAL LOW (ref 98–111)
GFR calc Af Amer: >60 mL/min (ref >60)
GFR calc non Af Amer: >60 mL/min (ref >60)
GLUCOSE: 136 mg/dL — AB (ref 70–99)
Potassium: 4 mmol/L (ref 3.5–5.1)
SODIUM: 131 mmol/L — AB (ref 135–145)
Total Bilirubin: 0.6 mg/dL (ref 0.3–1.2)
Total Protein: 7.8 g/dL (ref 6.5–8.1)

## 2018-09-27 LAB — RAPID HIV SCREEN (HIV 1/2 AB+AG)
HIV 1/2 Antibodies: NONREACTIVE
HIV-1 P24 ANTIGEN - HIV24: NONREACTIVE

## 2018-09-27 LAB — CBC WITH DIFFERENTIAL/PLATELET
Abs Immature Granulocytes: 0.03 10*3/uL (ref 0.00–0.07)
Basophils Absolute: 0 10*3/uL (ref 0.0–0.1)
Basophils Relative: 1 %
EOS ABS: 0.1 10*3/uL (ref 0.0–0.5)
EOS PCT: 2 %
HEMATOCRIT: 39.1 % (ref 36.0–46.0)
HEMOGLOBIN: 12.9 g/dL (ref 12.0–15.0)
Immature Granulocytes: 1 %
LYMPHS ABS: 2 10*3/uL (ref 0.7–4.0)
Lymphocytes Relative: 41 %
MCH: 29 pg (ref 26.0–34.0)
MCHC: 33 g/dL (ref 30.0–36.0)
MCV: 87.9 fL (ref 80.0–100.0)
MONO ABS: 0.6 10*3/uL (ref 0.1–1.0)
MONOS PCT: 12 %
Neutro Abs: 2.1 10*3/uL (ref 1.7–7.7)
Neutrophils Relative %: 43 %
Platelets: 176 10*3/uL (ref 150–400)
RBC: 4.45 MIL/uL (ref 3.87–5.11)
RDW: 12.4 % (ref 11.5–15.5)
WBC: 4.7 10*3/uL (ref 4.0–10.5)
nRBC: 0 % (ref 0.0–0.2)

## 2018-09-27 LAB — URINALYSIS, ROUTINE W REFLEX MICROSCOPIC
BILIRUBIN URINE: NEGATIVE
GLUCOSE, UA: NEGATIVE mg/dL
Hgb urine dipstick: NEGATIVE
Ketones, ur: 5 mg/dL — AB
LEUKOCYTES UA: NEGATIVE
Nitrite: NEGATIVE
PH: 5 (ref 5.0–8.0)
Protein, ur: 30 mg/dL — AB
Specific Gravity, Urine: 1.028 (ref 1.005–1.030)

## 2018-09-27 LAB — CK: CK TOTAL: 12 U/L — AB (ref 38–234)

## 2018-09-27 LAB — INFLUENZA PANEL BY PCR (TYPE A & B)
INFLAPCR: NEGATIVE
Influenza B By PCR: NEGATIVE

## 2018-09-27 LAB — I-STAT CG4 LACTIC ACID, ED
Lactic Acid, Venous: 3.04 mmol/L (ref 0.5–1.9)
Lactic Acid, Venous: 1.17 mmol/L (ref 0.5–1.9)

## 2018-09-27 LAB — PREGNANCY, URINE: Preg Test, Ur: NEGATIVE

## 2018-09-27 MED ORDER — SODIUM CHLORIDE 0.9 % IV BOLUS
1000.0000 mL | Freq: Once | INTRAVENOUS | Status: AC
  Administered 2018-09-27: 1000 mL via INTRAVENOUS

## 2018-09-27 MED ORDER — SODIUM CHLORIDE 0.9 % IV SOLN
2.0000 g | Freq: Once | INTRAVENOUS | Status: DC
  Administered 2018-09-28: 2 g via INTRAVENOUS
  Filled 2018-09-27: qty 2

## 2018-09-27 MED ORDER — ACETAMINOPHEN 500 MG PO TABS
1000.0000 mg | ORAL_TABLET | Freq: Once | ORAL | Status: AC
  Administered 2018-09-28: 1000 mg via ORAL
  Filled 2018-09-27: qty 2

## 2018-09-27 MED ORDER — VANCOMYCIN HCL IN DEXTROSE 1-5 GM/200ML-% IV SOLN
1000.0000 mg | Freq: Once | INTRAVENOUS | Status: DC
  Administered 2018-09-28: 1000 mg via INTRAVENOUS
  Filled 2018-09-27: qty 200

## 2018-09-27 MED ORDER — METRONIDAZOLE IN NACL 5-0.79 MG/ML-% IV SOLN
500.0000 mg | Freq: Three times a day (TID) | INTRAVENOUS | Status: DC
  Administered 2018-09-27: 500 mg via INTRAVENOUS
  Filled 2018-09-27: qty 100

## 2018-09-27 MED ORDER — VANCOMYCIN HCL IN DEXTROSE 1-5 GM/200ML-% IV SOLN: 1000.0000 mg | Freq: Once | INTRAVENOUS | Status: DC

## 2018-09-27 NOTE — Progress Notes (Signed)
ANTIBIOTIC CONSULT NOTE-Preliminary  Pharmacy Consult for Vancomycin and Cefepime Indication:SIRS  Allergies  Allergen Reactions  . Omniflox [Temafloxacin]   . Latex Rash    Only when on face  . Penicillins Rash    Has patient had a PCN reaction causing immediate rash, facial/tongue/throat swelling, SOB or lightheadedness with hypotension: Yes Has patient had a PCN reaction causing severe rash involving mucus membranes or skin necrosis: No Has patient had a PCN reaction that required hospitalization No Has patient had a PCN reaction occurring within the last 10 years: No If all of the above answers are "NO", then may proceed with Cephalosporin use.     Patient Measurements: Height: 5\' 5"  (165.1 cm) Weight: 210 lb (95.3 kg) IBW/kg (Calculated) : 57   Vital Signs: Temp: 98.9 F (37.2 C) (01/09 1948) Temp Source: Tympanic (01/09 1948) BP: 133/57 (01/09 1948) Pulse Rate: 106 (01/09 1948)  Labs: Recent Labs    09/27/18 2057  WBC 4.7  HGB 12.9  PLT 176  CREATININE 0.69    Estimated Creatinine Clearance: 109.9 mL/min (by C-G formula based on SCr of 0.69 mg/dL).  No results for input(s): VANCOTROUGH, VANCOPEAK, VANCORANDOM, GENTTROUGH, GENTPEAK, GENTRANDOM, TOBRATROUGH, TOBRAPEAK, TOBRARND, AMIKACINPEAK, AMIKACINTROU, AMIKACIN in the last 72 hours.   Microbiology: No results found for this or any previous visit (from the past 720 hour(s)).  Medical History: Past Medical History:  Diagnosis Date  . Anxiety   . Depression   . Diabetes mellitus without complication (HCC)     Medications:  Cefepime 2 Gm x 1 dose ordered by the EDP Vancomycin 1000 mg IV x 1 dose ordered by the EDP Metronidazole 500 mg IV ordered by the EDP  Assessment: 38 yo female seen in the ED for fever and joint pain for past several weeks. WBCs are wnl; lactic acid is elevated. Pharmacy has been consulted for cefepime and vancomycin dosing for SIRS/FUO.  Goal of Therapy:  Vancomycin troughs  15-20 mcg/ml Eradicate infection  Plan:  Preliminary review of pertinent patient information completed.  Protocol will be initiated with an additional dose of 1000 mg IV Vancomycin for a total loading dose of 2000 mg IV.  Jeani Hawking clinical pharmacist will complete review during morning rounds to assess patient and finalize treatment regimen if needed.  Arelia Sneddon, [facility] 09/27/2018,11:56 PM

## 2018-09-27 NOTE — ED Notes (Addendum)
Date and time results received: 09/27/18 21:27 (use smartphrase ".now" to insert current time)  Test: lactic Critical Value: 3.04  Name of Provider Notified: Jefm Bryant  Orders Received? Or Actions Taken?: Provider notified

## 2018-09-27 NOTE — ED Provider Notes (Signed)
[facility] [place]ERGENCY DEPARTMENT Provider Note   CSN: [id number] Arrival date & time: 09/27/18  6122     History   Chief Complaint Chief Complaint  Patient presents with  . Fever    body aches    HPI Crystal Mccoy is a 38 y.o. female who presents with fever and joint pain/body aches. PMH significant for anxiety/depression, diabetes. She states that before New Years she started to have body aches and attributed this to the holidays. This has been going on about 4 weeks. She has been having low grade fevers (~100.5) intermittently over the past 12 days. She is also having joint pain, particularly in her hips, ankles, and her right big toe. She is have muscle aches in her thighs and upper arms. The pain is worse when she has a fever and improves after her fever breaks. She had a headache tonight along with nausea and her temp was 101.7 which worried her so she came to the ED. She has seen her PCP for this problem and they have drawn labs including lyme titers and labs for autoimmune disease. She tested negative for the flu. She is a Theatre stage manager. She denies family hx of the same. She denies travel, tick bites, neck pain, URI symptoms, cough, chest pain, SOB, abdominal pain, vomiting, diarrhea, urinary symptoms, vaginal discharge, rashes. She hasn't had any recent exposures at work.   HPI  Past Medical History:  Diagnosis Date  . Anxiety   . Depression   . Diabetes mellitus without complication (HCC)     There are no active problems to display for this patient.   Past Surgical History:  Procedure Laterality Date  . CESAREAN SECTION    . TONSILLECTOMY       OB History    Gravida  2   Para  2   Term  2   Preterm      AB      Living        SAB      TAB      Ectopic      Multiple      Live Births               Home Medications    Prior to Admission medications   Medication Sig Start Date End Date Taking? Authorizing Provider  desvenlafaxine (PRISTIQ)  50 MG 24 hr tablet Take 50 mg by mouth daily.   Yes [provider]  liraglutide (VICTOZA) 18 MG/3ML SOPN Inject 1.8 mg into the skin.   Yes [provider]  Norgestimate-Ethinyl Estradiol Triphasic (ORTHO TRI-CYCLEN LO) 0.18/0.215/0.25 MG-25 MCG tab Take 1 tablet by mouth daily.   Yes [provider]  Vitamin D, Ergocalciferol, (DRISDOL) 50000 units CAPS capsule Take 50,000 Units by mouth every 7 (seven) days.   Yes [provider]  hydrOXYzine (ATARAX/VISTARIL) 25 MG tablet Take 1 tablet (25 mg total) by mouth every 8 (eight) hours as needed for anxiety. 08/19/16   Benjiman Core, MD  Inositol-D Chiro-Inositol (OVASITOL PO) Take by mouth.    [provider]  phentermine 37.5 MG capsule Take 37.5 mg by mouth every morning.    [provider]    Family History Family History  Problem Relation Age of Onset  . Hypertension Father   . Stroke Father   . Cancer - Other Father   . Seizures Father   . Diabetes Father     Social History Social History   Tobacco Use  .  Smoking status: Never Smoker  . Smokeless tobacco: Never Used  Substance Use Topics  . Alcohol use: Yes  . Drug use: No     Allergies   Omniflox [temafloxacin]; Latex; and Penicillins   Review of Systems Review of Systems  Constitutional: Positive for appetite change, chills, diaphoresis, fatigue and fever.  HENT: Negative for congestion, ear pain and sore throat.   Respiratory: Negative for cough and shortness of breath.   Cardiovascular: Negative for chest pain.  Gastrointestinal: Positive for nausea. Negative for abdominal pain, diarrhea and vomiting.  Genitourinary: Negative for difficulty urinating, dysuria, flank pain, hematuria, pelvic pain, vaginal bleeding and vaginal discharge.  Musculoskeletal: Positive for arthralgias and myalgias. Negative for joint swelling, neck pain and neck stiffness.  Skin: Negative for color change and rash.    Allergic/Immunologic: Negative for immunocompromised state.  Neurological: Positive for headaches. Negative for syncope and light-headedness.  All other systems reviewed and are negative.    Physical Exam Updated Vital Signs BP (!) 133/57 (BP Location: Right Arm)   Pulse (!) 106   Temp 98.9 F (37.2 C) (Tympanic)   Resp 19   Ht 5\' 5"  (1.651 m)   Wt 95.3 kg   LMP 09/20/2018 (Exact Date)   SpO2 98%   BMI 34.95 kg/m   Physical Exam Vitals signs and nursing note reviewed.  Constitutional:      General: She is not in acute distress.    Appearance: She is well-developed. She is obese. She is diaphoretic (from fever breaking). She is not ill-appearing.  HENT:     Head: Normocephalic and atraumatic.     Right Ear: Tympanic membrane normal.     Left Ear: Tympanic membrane normal.     Nose: Nose normal.  Eyes:     General: No scleral icterus.       Right eye: No discharge.        Left eye: No discharge.     Conjunctiva/sclera: Conjunctivae normal.     Pupils: Pupils are equal, round, and reactive to light.  Neck:     Musculoskeletal: Normal range of motion.  Cardiovascular:     Rate and Rhythm: Tachycardia present.  Pulmonary:     Effort: Pulmonary effort is normal. No respiratory distress.     Breath sounds: Normal breath sounds.  Abdominal:     General: Abdomen is flat. Bowel sounds are normal. There is no distension.     Palpations: Abdomen is soft.     Tenderness: There is no abdominal tenderness.  Musculoskeletal:     Comments: No obvious swelling, deformity, or warmth of bilateral hips, knees, ankles, feet. No tenderness to palpation of joints. Pain can be elicited with ROM, particularly of the right hip joint. 5/5 upper and lower extremity strength. N/V intact.   Skin:    General: Skin is warm.  Neurological:     Mental Status: She is alert and oriented to person, place, and time.  Psychiatric:        Behavior: Behavior normal.      ED Treatments / Results   Labs (all labs ordered are listed, but only abnormal results are displayed) Labs Reviewed  COMPREHENSIVE METABOLIC PANEL - Abnormal; Notable for the following components:      Result Value   Sodium 131 (*)    Chloride 97 (*)    Glucose, Bld 136 (*)    All other components within normal limits  URINALYSIS, ROUTINE W REFLEX MICROSCOPIC - Abnormal; Notable for the following components:  Color, Urine AMBER (*)    APPearance HAZY (*)    Ketones, ur 5 (*)    Protein, ur 30 (*)    Bacteria, UA RARE (*)    All other components within normal limits  CK - Abnormal; Notable for the following components:   Total CK 12 (*)    All other components within normal limits  I-STAT CG4 LACTIC ACID, ED - Abnormal; Notable for the following components:   Lactic Acid, Venous 3.04 (*)    All other components within normal limits  CULTURE, BLOOD (ROUTINE X 2)  CULTURE, BLOOD (ROUTINE X 2)  CBC WITH DIFFERENTIAL/PLATELET  PREGNANCY, URINE  RAPID HIV SCREEN (HIV 1/2 AB+AG)  INFLUENZA PANEL BY PCR (TYPE A & B)  I-STAT CG4 LACTIC ACID, ED    EKG None  Radiology Dg Chest 2 View  Result Date: 09/27/2018 CLINICAL DATA:  Generalized body aches starting 4 weeks ago. Night sweats. Low grade fever for 12 days. Increased fever and diaphoresis today. History of diabetes. EXAM: CHEST - 2 VIEW COMPARISON:  01/03/2007 FINDINGS: The heart size and mediastinal contours are within normal limits. Both lungs are clear. The visualized skeletal structures are unremarkable. IMPRESSION: No active cardiopulmonary disease. Electronically Signed   By: Burman NievesWilliam  Stevens M.D.   On: 09/27/2018 21:24    Procedures Procedures (including critical care time)  Medications Ordered in ED Medications  acetaminophen (TYLENOL) tablet 1,000 mg (has no administration in time range)  ceFEPIme (MAXIPIME) 2 g in sodium chloride 0.9 % 100 mL IVPB (has no administration in time range)  metroNIDAZOLE (FLAGYL) IVPB 500 mg (has no  administration in time range)  vancomycin (VANCOCIN) IVPB 1000 mg/200 mL premix (has no administration in time range)  sodium chloride 0.9 % bolus 1,000 mL (0 mLs Intravenous Stopped 09/27/18 2307)     Initial Impression / Assessment and Plan / ED Course  I have reviewed the triage vital signs and the nursing notes.  Pertinent labs & imaging results that were available during my care of the patient were reviewed by me and considered in my medical decision making (see chart for details).  38 year old female presents with FUO and body aches/joint pain for weeks. She has mild tachycardia but otherwise vitals are normal. Exam is unrevealing other than diaphoresis from fever breaking. Will obtain labs, CXR, UA, lactic acid, blood cultures and reassess.  I-stat lactate is 3.04. CBC is normal. CMP is unremarkable. UA has 5 ketones and 30 protein. CXR is negative. Rapid HIV is negative. Shared visit with Dr. Jacqulyn BathLong. Will add CK, flu.   CK and flu are negative. Will cover with fluids and antibiotics. Will consult hospitalist for admission.  11:21 PM Discussed with Dr. Sharl MaLama who will come to see the patient.    Final Clinical Impressions(s) / ED Diagnoses   Final diagnoses:  SIRS (systemic inflammatory response syndrome) (HCC)  Arthralgia, unspecified joint  Body aches    ED Discharge Orders    None       Bethel BornGekas, Lilliah Priego Marie, PA-C 09/27/18 2332    Eber HongMiller, Brian, MD 09/28/18 [phone]

## 2018-09-27 NOTE — ED Triage Notes (Signed)
Pt reports labs were sent off for RA and lime disease at PCP office.

## 2018-09-27 NOTE — ED Triage Notes (Signed)
Pt reports body aches that started 4 weeks ago. Pt reports joint pain, night sweats and low grade started about 12 days ago. Pt went to PCP on Tuesday and was negative for flu, they obtained blood work but pt hasn't received results yet. Pt fever today was 101.7 at 6pm and she took ibuprofen. No fever at this time, but pt is diaphoretic.

## 2018-09-28 ENCOUNTER — Observation Stay (HOSPITAL_COMMUNITY): Payer: Managed Care, Other (non HMO)

## 2018-09-28 ENCOUNTER — Encounter (HOSPITAL_COMMUNITY): Payer: Self-pay

## 2018-09-28 DIAGNOSIS — M2550 Pain in unspecified joint: Secondary | ICD-10-CM

## 2018-09-28 DIAGNOSIS — M25559 Pain in unspecified hip: Secondary | ICD-10-CM | POA: Diagnosis present

## 2018-09-28 DIAGNOSIS — M25551 Pain in right hip: Secondary | ICD-10-CM | POA: Diagnosis not present

## 2018-09-28 DIAGNOSIS — M5126 Other intervertebral disc displacement, lumbar region: Secondary | ICD-10-CM

## 2018-09-28 DIAGNOSIS — R6510 Systemic inflammatory response syndrome (SIRS) of non-infectious origin without acute organ dysfunction: Secondary | ICD-10-CM

## 2018-09-28 DIAGNOSIS — R52 Pain, unspecified: Secondary | ICD-10-CM | POA: Diagnosis not present

## 2018-09-28 LAB — COMPREHENSIVE METABOLIC PANEL
ALT: 46 U/L — ABNORMAL HIGH (ref 0–44)
AST: 81 U/L — ABNORMAL HIGH (ref 15–41)
Albumin: 3.7 g/dL (ref 3.5–5.0)
Alkaline Phosphatase: 115 U/L (ref 38–126)
Anion gap: 9 (ref 5–15)
BUN: 9 mg/dL (ref 6–20)
CO2: 26 mmol/L (ref 22–32)
Calcium: 8.7 mg/dL — ABNORMAL LOW (ref 8.9–10.3)
Chloride: 99 mmol/L (ref 98–111)
Creatinine, Ser: 0.57 mg/dL (ref 0.44–1.00)
GFR calc Af Amer: >60 mL/min (ref >60)
GFR calc non Af Amer: >60 mL/min (ref >60)
Glucose, Bld: 138 mg/dL — ABNORMAL HIGH (ref 70–99)
Potassium: 3.8 mmol/L (ref 3.5–5.1)
Sodium: 134 mmol/L — ABNORMAL LOW (ref 135–145)
Total Bilirubin: 0.8 mg/dL (ref 0.3–1.2)
Total Protein: 7.5 g/dL (ref 6.5–8.1)

## 2018-09-28 LAB — CK: CK TOTAL: 21 U/L — AB (ref 38–234)

## 2018-09-28 LAB — SEDIMENTATION RATE: Sed Rate: 28 mm/hr — ABNORMAL HIGH (ref 0–22)

## 2018-09-28 LAB — CBC
HCT: 38.6 % (ref 36.0–46.0)
HEMOGLOBIN: 12.6 g/dL (ref 12.0–15.0)
MCH: 29.5 pg (ref 26.0–34.0)
MCHC: 32.6 g/dL (ref 30.0–36.0)
MCV: 90.4 fL (ref 80.0–100.0)
Platelets: 166 10*3/uL (ref 150–400)
RBC: 4.27 MIL/uL (ref 3.87–5.11)
RDW: 12.8 % (ref 11.5–15.5)
WBC: 3.6 10*3/uL — ABNORMAL LOW (ref 4.0–10.5)
nRBC: 0 % (ref 0.0–0.2)

## 2018-09-28 LAB — C-REACTIVE PROTEIN: CRP: 8.3 mg/dL — ABNORMAL HIGH (ref <1.0)

## 2018-09-28 LAB — GLUCOSE, CAPILLARY
Glucose-Capillary: 160 mg/dL — ABNORMAL HIGH (ref 70–99)
Glucose-Capillary: 135 mg/dL — ABNORMAL HIGH (ref 70–99)
Glucose-Capillary: 231 mg/dL — ABNORMAL HIGH (ref 70–99)
Glucose-Capillary: 224 mg/dL — ABNORMAL HIGH (ref 70–99)

## 2018-09-28 LAB — HEMOGLOBIN A1C
Hgb A1c MFr Bld: 6.1 % — ABNORMAL HIGH (ref 4.8–5.6)
Mean Plasma Glucose: 128.37 mg/dL

## 2018-09-28 LAB — FERRITIN: Ferritin: 315 ng/mL — ABNORMAL HIGH (ref 11–307)

## 2018-09-28 LAB — LACTATE DEHYDROGENASE: LDH: 330 U/L — ABNORMAL HIGH (ref 98–192)

## 2018-09-28 MED ORDER — IOPAMIDOL (ISOVUE-300) INJECTION 61%
100.0000 mL | Freq: Once | INTRAVENOUS | Status: AC | PRN
  Administered 2018-09-28: 100 mL via INTRAVENOUS

## 2018-09-28 MED ORDER — IOPAMIDOL (ISOVUE-300) INJECTION 61%
30.0000 mL | Freq: Once | INTRAVENOUS | Status: AC | PRN
  Administered 2018-09-28: 12:00:00 via ORAL

## 2018-09-28 MED ORDER — VENLAFAXINE HCL ER 75 MG PO CP24
75.0000 mg | ORAL_CAPSULE | Freq: Every day | ORAL | Status: DC
  Filled 2018-09-28 (×2): qty 1

## 2018-09-28 MED ORDER — IBUPROFEN 400 MG PO TABS
200.0000 mg | ORAL_TABLET | Freq: Four times a day (QID) | ORAL | Status: DC | PRN
  Administered 2018-09-28 (×2): 200 mg via ORAL
  Filled 2018-09-28 (×3): qty 1

## 2018-09-28 MED ORDER — ONDANSETRON HCL 4 MG PO TABS: 4.0000 mg | ORAL_TABLET | Freq: Four times a day (QID) | ORAL | Status: DC | PRN

## 2018-09-28 MED ORDER — INSULIN ASPART 100 UNIT/ML ~~LOC~~ SOLN
0.0000 [IU] | Freq: Three times a day (TID) | SUBCUTANEOUS | Status: DC
  Administered 2018-09-28: 3 [IU] via SUBCUTANEOUS
  Administered 2018-09-28: 1 [IU] via SUBCUTANEOUS
  Administered 2018-09-28: 2 [IU] via SUBCUTANEOUS
  Administered 2018-09-29: 1 [IU] via SUBCUTANEOUS
  Administered 2018-09-29: 2 [IU] via SUBCUTANEOUS
  Administered 2018-09-29 – 2018-09-30 (×2): 3 [IU] via SUBCUTANEOUS
  Administered 2018-09-30: 2 [IU] via SUBCUTANEOUS
  Administered 2018-09-30: 1 [IU] via SUBCUTANEOUS
  Administered 2018-10-01: 2 [IU] via SUBCUTANEOUS
  Administered 2018-10-01: 3 [IU] via SUBCUTANEOUS

## 2018-09-28 MED ORDER — DIPHENHYDRAMINE HCL 25 MG PO CAPS: 25.0000 mg | ORAL_CAPSULE | Freq: Three times a day (TID) | ORAL | Status: DC | PRN

## 2018-09-28 MED ORDER — ENOXAPARIN SODIUM 40 MG/0.4ML ~~LOC~~ SOLN
40.0000 mg | SUBCUTANEOUS | Status: DC
  Filled 2018-09-28 (×3): qty 0.4

## 2018-09-28 MED ORDER — ONDANSETRON HCL 4 MG/2ML IJ SOLN
4.0000 mg | Freq: Four times a day (QID) | INTRAMUSCULAR | Status: DC | PRN
  Administered 2018-09-28: 4 mg via INTRAVENOUS
  Filled 2018-09-28: qty 2

## 2018-09-28 MED ORDER — ACETAMINOPHEN 325 MG PO TABS
650.0000 mg | ORAL_TABLET | Freq: Four times a day (QID) | ORAL | Status: DC | PRN
  Administered 2018-09-28 – 2018-10-01 (×9): 650 mg via ORAL
  Filled 2018-09-28 (×9): qty 2

## 2018-09-28 MED ORDER — SODIUM CHLORIDE 0.9 % IV SOLN
INTRAVENOUS | Status: DC
  Administered 2018-09-28: 06:00:00 via INTRAVENOUS

## 2018-09-28 NOTE — Consult Note (Signed)
HOSPITAL CONSULT (HIP FRACTURE )  MDM= MODERATE COMPLEXITY COMP HISTORY COMP EXAM  Patient ID: Crystal Mccoy, female   DOB: 1981/09/11, 38 y.o.   MRN: [id number]  New patient  Requested by:  Reason for:   Chief Complaint  Patient presents with  . Fever    body aches     Crystal Mccoy is a 38 y.o. female.    HPI  38 year old female presented to the ER after several days of fever back pain knee pain and pain right foot.  Patient had intermittent fevers did not note any symptoms of flu other than some muscle aches.  Pain in her right hip radiate down to her right knee and foot without any associated history of trauma.  She describes her pain is severe and a dull ache No modifying factors  She had a CAT scan in the hospital of her hip that was normal  Review of Systems (all) ROS  No weight loss or weight gain again she did have fever Ear nose and throat was normal eyes were normal No chest pain No shortness of breath No nausea vomiting No urgency or dysuria No rashes Denies numbness and tingling unsteady gait or tremor No anxiety depression or hallucinations Denies easy bruising and bleeding Denies thirst excessive for urination excessive Denies major allergies  Musculoskeletal as described  Past Medical History:  Diagnosis Date  . Anxiety   . Depression   . Diabetes mellitus without complication [facility])     Past Surgical History:  Procedure Laterality Date  . CESAREAN SECTION    . TONSILLECTOMY      Family History  Problem Relation Age of Onset  . Hypertension Father   . Stroke Father   . Cancer - Other Father   . Seizures Father   . Diabetes Father    Social History Social History   Tobacco Use  . Smoking status: Never Smoker  . Smokeless tobacco: Never Used  Substance Use Topics  . Alcohol use: Yes  . Drug use: No    Allergies  Allergen Reactions  . Omniflox [Temafloxacin]   . Latex Rash    Only when on face  . Penicillins Rash    Has  patient had a PCN reaction causing immediate rash, facial/tongue/throat swelling, SOB or lightheadedness with hypotension: Yes Has patient had a PCN reaction causing severe rash involving mucus membranes or skin necrosis: No Has patient had a PCN reaction that required hospitalization No Has patient had a PCN reaction occurring within the last 10 years: No If all of the above answers are "NO", then may proceed with Cephalosporin use.     Current Facility-Administered Medications  Medication Dose Route Frequency Provider Last Rate Last Dose  . 0.9 %  sodium chloride infusion   Intravenous Continuous Oswald Hillock, MD 75 mL/hr at 09/28/18 0558    . acetaminophen (TYLENOL) tablet 650 mg  650 mg Oral Q6H PRN Oswald Hillock, MD   650 mg at 09/28/18 0558  . diphenhydrAMINE (BENADRYL) capsule 25 mg  25 mg Oral Q8H PRN Oswald Hillock, MD      . enoxaparin (LOVENOX) injection 40 mg  40 mg Subcutaneous Q24H [place], Gagan S, MD      . ibuprofen (ADVIL,MOTRIN) tablet 200 mg  200 mg Oral Q6H PRN Caren Griffins, MD   200 mg at 09/28/18 0836  . insulin aspart (novoLOG) injection 0-9 Units  0-9 Units Subcutaneous TID WC Oswald Hillock, MD   1  Units at 09/28/18 1218  . ondansetron (ZOFRAN) tablet 4 mg  4 mg Oral Q6H PRN Oswald Hillock, MD       Or  . ondansetron [facility]) injection 4 mg  4 mg Intravenous Q6H PRN Oswald Hillock, MD   4 mg at 09/28/18 1149  . venlafaxine XR (EFFEXOR-XR) 24 hr capsule 75 mg  75 mg Oral Q breakfast Oswald Hillock, MD         Physical Exam(=30) BP 138/84   Pulse (!) 110   Temp (!) 101.5 F (38.6 C) (Oral)   Resp 20   Ht 5' 5"  (1.651 m)   Wt 95.2 kg   LMP 09/20/2018 (Exact Date)   SpO2 100%   BMI 34.93 kg/m   Gen. Appearance appears fatigued otherwise normal Peripheral vascular system no peripheral edema warm extremities Lymph nodes negative in the groin bilaterally Gait normal  Left Upper extremity  Inspection revealed no malalignment or asymmetry  Assessment of range  of motion: Full range of motion was recorded  Assessment of stability: Elbow wrist and hand and shoulder were stable  Assessment of muscle strength and tone revealed grade 5 muscle strength and normal muscle tone  Skin was normal without rash lesion or ulceration  Right upper extremity  Inspection revealed no malalignment or asymmetry  Assessment of range of motion: Full range of motion was recorded  Assessment of stability: Elbow wrist and hand and shoulder were stable  Assessment of muscle strength and tone revealed grade 5 muscle strength and normal muscle tone  Skin was normal without rash lesion or ulceration  Right Lower extremity  Inspection revealed no malalignment or asymmetry  Assessment of range of motion: Full range of motion was recorded  Assessment of stability: Ankle, knee and hip were stable  Assessment of muscle strength and tone revealed grade 5 muscle strength and normal muscle tone  Skin was normal without rash lesion or ulceration  Left lower extremity  Inspection revealed no malalignment or asymmetry  Assessment of range of motion: Full range of motion was recorded  Assessment of stability: Ankle, knee and hip were stable  Assessment of muscle strength and tone revealed grade 5 muscle strength and normal muscle tone  Skin was normal without rash lesion or ulceration   Coordination was tested by finger-to-nose nose and was normal Deep tendon reflexes were 2+ in the upper extremities and 2+ in the lower extremities Examination of sensation by touch was normal Straight leg raise on the right was positive at 65 degrees  L-spine tenderness in the right lower back and midline nontender thoracic and cervical spine Painful range of motion with rotation normal flexion normal extension  Mental status  Oriented to time person and place normal  Mood and affect normal without depression anxiety or agitation  Dx: Differential diagnosis Herniated disc Degenerative  disc Spinal infection Flu and any of the above   Data Reviewed  I reviewed the following images and the reports and my independent interpretation is CT scan hip was negative   Assessment  CBC    Component Value Date/Time   WBC 3.6 (L) 09/28/2018 0602   RBC 4.27 09/28/2018 0602   HGB 12.6 09/28/2018 0602   HCT 38.6 09/28/2018 0602   PLT 166 09/28/2018 0602   MCV 90.4 09/28/2018 0602   MCH 29.5 09/28/2018 0602   MCHC 32.6 09/28/2018 0602   RDW 12.8 09/28/2018 0602   LYMPHSABS 2.0 09/27/2018 2057   MONOABS 0.6 09/27/2018 2057  EOSABS 0.1 09/27/2018 2057   BASOSABS 0.0 09/27/2018 2057   Esr pending crp pending    Plan  MRI lumbar  Carole Civil MD

## 2018-09-28 NOTE — H&P (Signed)
[facility] H&P    Patient Demographics:    Crystal Mccoy, is a 38 y.o. female  MRN: [id number]  DOB - 04/13/1981  Admit Date - 09/27/2018  Referring MD/NP/PA: Alona BeneJoshua Long  Outpatient Primary MD for the patient is System, Pcp Not In  Patient coming from: Home  Chief complaint-fever/joint pains   HPI:    Crystal Mccoy  is a 38 y.o. female,With history of diabetes mellitus type II, came to hospital with 4-week history of joint pains with intermittent fever.  Patient says that she started having joint pains involving mainly the right hip joint and knee along with right big toe.  She noticed that she was having spasms involved with the right hip which led to stiffness of the hip joint initially and she was unable to move for 20 minutes.  She also has been having intermittent stiffness involving the right knee as well as right big toe.  She also has been having low-grade temperature intermittently over the past 4 weeks.  Patient says that temperature was 101.7 today at home so she came back to ED.  However in the ED patient is afebrile.  Lab work initially showed lactic acid 3.17 but cleared in 2 hours after patient received IV fluids and empiric antibiotics.  Patient did not have hypotension or tachypnea.  She did have  mild tachycardia with heart rate 106.  Patient says that she was seen by her PCP and she had a complete work-up including labs for rheumatoid arthritis, Lyme disease, connective tissue disorder, autoimmune work-up.  Labs are pending at this time.  She denies neck pain, photophobia Denies toothache or gum problems Denies sinus drainage, no runny nose or sore throat. Denies chest pain, no shortness of breath.  No coughing up phlegm. Denies nausea, vomiting or diarrhea. Denies abdominal pain. Denies vaginal discharge. No previous history of pelvic inflammatory disease Patient says that she has not eaten food for  past 3 to 4 days. Denies calf tenderness   Review of systems:    In addition to the HPI above,    All other systems reviewed and are negative.    Past History of the following :    Past Medical History:  Diagnosis Date  . Anxiety   . Depression   . Diabetes mellitus without complication [facility](HCC)       Past Surgical History:  Procedure Laterality Date  . CESAREAN SECTION    . TONSILLECTOMY        Social History:      Social History   Tobacco Use  . Smoking status: Never Smoker  . Smokeless tobacco: Never Used  Substance Use Topics  . Alcohol use: Yes       Family History :     Family History  Problem Relation Age of Onset  . Hypertension Father   . Stroke Father   . Cancer - Other Father   . Seizures Father   . Diabetes Father      Home Medications:   Prior to Admission medications   Medication Sig Start  Date End Date Taking? Authorizing Provider  desvenlafaxine (PRISTIQ) 50 MG 24 hr tablet Take 50 mg by mouth daily.   Yes [provider]  liraglutide (VICTOZA) 18 MG/3ML SOPN Inject 1.8 mg into the skin.   Yes [provider]  Norgestimate-Ethinyl Estradiol Triphasic (ORTHO TRI-CYCLEN LO) 0.18/0.215/0.25 MG-25 MCG tab Take 1 tablet by mouth daily.   Yes [provider]  Vitamin D, Ergocalciferol, (DRISDOL) 50000 units CAPS capsule Take 50,000 Units by mouth every 7 (seven) days.   Yes [provider]  hydrOXYzine (ATARAX/VISTARIL) 25 MG tablet Take 1 tablet (25 mg total) by mouth every 8 (eight) hours as needed for anxiety. 08/19/16   Benjiman Core, MD  Inositol-D Chiro-Inositol (OVASITOL PO) Take by mouth.    [provider]  phentermine 37.5 MG capsule Take 37.5 mg by mouth every morning.    [provider]     Allergies:     Allergies  Allergen Reactions  . Omniflox [Temafloxacin]   . Latex Rash    Only when on face  . Penicillins Rash    Has patient had a PCN reaction causing immediate  rash, facial/tongue/throat swelling, SOB or lightheadedness with hypotension: Yes Has patient had a PCN reaction causing severe rash involving mucus membranes or skin necrosis: No Has patient had a PCN reaction that required hospitalization No Has patient had a PCN reaction occurring within the last 10 years: No If all of the above answers are "NO", then may proceed with Cephalosporin use.      Physical Exam:   Vitals  Blood pressure (!) 133/57, pulse (!) 106, temperature 98.9 F (37.2 C), temperature source Tympanic, resp. rate 19, height 5\' 5"  (1.651 m), weight 95.3 kg, last menstrual period 09/20/2018, SpO2 98 %.  1.  General: Appears in no acute distress  2. Psychiatric:  Intact judgement and  insight, awake alert, oriented x 3.  3. Neurologic: No focal neurological deficits, all cranial nerves intact.Strength 5/5 all 4 extremities, sensation intact all 4 extremities, plantars down going.  4. Eyes :  anicteric sclerae, moist conjunctivae with no lid lag. PERRLA.  5. ENMT:  Oropharynx clear with moist mucous membranes and good dentition  6. Neck:  supple, no cervical lymphadenopathy appriciated, No thyromegaly  7. Respiratory : Normal respiratory effort, good air movement bilaterally,clear to  auscultation bilaterally  8. Cardiovascular : RRR, no gallops, rubs or murmurs, no leg edema  9. Gastrointestinal:  Positive bowel sounds, abdomen soft, non-tender to palpation,no hepatosplenomegaly, no rigidity or guarding       10. Skin:  No cyanosis, normal texture and turgor, no rash, lesions or ulcers  11.Musculoskeletal:  Tenderness noted at right hip on internal and external rotation.  No limitation of range of motion on flexion and extension.  No calf tenderness on palpation.    Data Review:    CBC Recent Labs  Lab 09/27/18 2057  WBC 4.7  HGB 12.9  HCT 39.1  PLT 176  MCV 87.9  MCH 29.0  MCHC 33.0  RDW 12.4  LYMPHSABS 2.0  MONOABS 0.6  EOSABS 0.1    BASOSABS 0.0   ------------------------------------------------------------------------------------------------------------------  Results for orders placed or performed during the hospital encounter of 09/27/18 (from the past 48 hour(s))  CK     Status: Abnormal   Collection Time: 09/27/18  8:47 PM  Result Value Ref Range   Total CK 12 (L) 38 - 234 U/L    Comment: Performed at [facility], [address] [place], [place]  [id number]  Comprehensive metabolic panel     Status: Abnormal   Collection Time: 09/27/18  8:57 PM  Result Value Ref Range   Sodium 131 (L) 135 - 145 mmol/L   Potassium 4.0 3.5 - 5.1 mmol/L   Chloride 97 (L) 98 - 111 mmol/L   CO2 22 22 - 32 mmol/L   Glucose, Bld 136 (H) 70 - 99 mg/dL   BUN 10 6 - 20 mg/dL   Creatinine, Ser 8.410.69 0.44 - 1.00 mg/dL   Calcium 9.1 8.9 - 32.410.3 mg/dL   Total Protein 7.8 6.5 - 8.1 g/dL   Albumin 3.9 3.5 - 5.0 g/dL   AST 41 15 - 41 U/L   ALT 30 0 - 44 U/L   Alkaline Phosphatase 88 38 - 126 U/L   Total Bilirubin 0.6 0.3 - 1.2 mg/dL   GFR calc non Af Amer >60 >60 mL/min   GFR calc Af Amer >60 >60 mL/min   Anion gap 12 5 - 15    Comment: Performed at [facility], [address] [place], [place] [address]  CBC with Differential     Status: None   Collection Time: 09/27/18  8:57 PM  Result Value Ref Range   WBC 4.7 4.0 - 10.5 K/uL   RBC 4.45 3.87 - 5.11 MIL/uL   Hemoglobin 12.9 12.0 - 15.0 g/dL   HCT 72.539.1 36.636.0 - 44.046.0 %   MCV 87.9 80.0 - 100.0 fL   MCH 29.0 26.0 - 34.0 pg   MCHC 33.0 30.0 - 36.0 g/dL   RDW 34.712.4 42.511.5 - 95.615.5 %   Platelets 176 150 - 400 K/uL   nRBC 0.0 0.0 - 0.2 %   Neutrophils Relative % 43 %   Neutro Abs 2.1 1.7 - 7.7 K/uL   Lymphocytes Relative 41 %   Lymphs Abs 2.0 0.7 - 4.0 K/uL   Monocytes Relative 12 %   Monocytes Absolute 0.6 0.1 - 1.0 K/uL   Eosinophils Relative 2 %   Eosinophils Absolute 0.1 0.0 - 0.5 K/uL   Basophils Relative 1 %   Basophils Absolute 0.0 0.0 - 0.1 K/uL   Immature Granulocytes 1 %    Abs Immature Granulocytes 0.03 0.00 - 0.07 K/uL   Reactive, Benign Lymphocytes PRESENT     Comment: Performed at [facility], [address] [place], [place] [address]  I-Stat CG4 Lactic Acid, ED     Status: Abnormal   Collection Time: 09/27/18  9:11 PM  Result Value Ref Range   Lactic Acid, Venous 3.04 (HH) 0.5 - 1.9 mmol/L   Comment NOTIFIED PHYSICIAN   Rapid HIV screen (HIV 1/2 Ab+Ag)     Status: None   Collection Time: 09/27/18  9:23 PM  Result Value Ref Range   HIV-1 P24 Antigen - HIV24 NON REACTIVE NON REACTIVE   HIV 1/2 Antibodies NON REACTIVE NON REACTIVE   Interpretation (HIV Ag Ab)      A non reactive test result means that HIV 1 or HIV 2 antibodies and HIV 1 p24 antigen were not detected in the specimen.    Comment: Performed at [facility], [address] [place], [place] [address]  Urinalysis, Routine w reflex microscopic     Status: Abnormal   Collection Time: 09/27/18  9:51 PM  Result Value Ref Range   Color, Urine AMBER (A) YELLOW    Comment: BIOCHEMICALS MAY BE AFFECTED BY COLOR   APPearance HAZY (A) CLEAR   Specific Gravity, Urine 1.028 1.005 - 1.030  pH 5.0 5.0 - 8.0   Glucose, UA NEGATIVE NEGATIVE mg/dL   Hgb urine dipstick NEGATIVE NEGATIVE   Bilirubin Urine NEGATIVE NEGATIVE   Ketones, ur 5 (A) NEGATIVE mg/dL   Protein, ur 30 (A) NEGATIVE mg/dL   Nitrite NEGATIVE NEGATIVE   Leukocytes, UA NEGATIVE NEGATIVE   RBC / HPF 0-5 0 - 5 RBC/hpf   WBC, UA 0-5 0 - 5 WBC/hpf   Bacteria, UA RARE (A) NONE SEEN   Squamous Epithelial / LPF 0-5 0 - 5   Mucus PRESENT     Comment: Performed at [facility], [address] [place], [place] [address]  Pregnancy, urine     Status: None   Collection Time: 09/27/18  9:51 PM  Result Value Ref Range   Preg Test, Ur NEGATIVE NEGATIVE    Comment:        THE SENSITIVITY OF THIS METHODOLOGY IS >20 mIU/mL. Performed at [facility], [address] [place], [place] [address]   Influenza panel by PCR (type A & B)     Status:  None   Collection Time: 09/27/18  9:51 PM  Result Value Ref Range   Influenza A By PCR NEGATIVE NEGATIVE   Influenza B By PCR NEGATIVE NEGATIVE    Comment: (NOTE) The Xpert Xpress Flu assay is intended as an aid in the diagnosis of  influenza and should not be used as a sole basis for treatment.  This  assay is FDA approved for nasopharyngeal swab specimens only. Nasal  washings and aspirates are unacceptable for Xpert Xpress Flu testing. Performed at [facility], [address], [place], [place] [address]   I-Stat CG4 Lactic Acid, ED     Status: None   Collection Time: 09/27/18 11:26 PM  Result Value Ref Range   Lactic Acid, Venous 1.17 0.5 - 1.9 mmol/L    Chemistries  Recent Labs  Lab 09/27/18 2057  NA 131*  K 4.0  CL 97*  CO2 22  GLUCOSE 136*  BUN 10  CREATININE 0.69  CALCIUM 9.1  AST 41  ALT 30  ALKPHOS 88  BILITOT 0.6   ------------------------------------------------------------------------------------------------------------------  ------------------------------------------------------------------------------------------------------------------ GFR: Estimated Creatinine Clearance: 109.9 mL/min (by C-G formula based on SCr of 0.69 mg/dL). Liver Function Tests: Recent Labs  Lab 09/27/18 2057  AST 41  ALT 30  ALKPHOS 88  BILITOT 0.6  PROT 7.8  ALBUMIN 3.9   No results for input(s): LIPASE, AMYLASE in the last 168 hours. No results for input(s): AMMONIA in the last 168 hours. Coagulation Profile: No results for input(s): INR, PROTIME in the last 168 hours. Cardiac Enzymes: Recent Labs  Lab 09/27/18 2047  CKTOTAL 12*   BNP (last 3 results) No results for input(s): PROBNP in the last 8760 hours. HbA1C: No results for input(s): HGBA1C in the last 72 hours. CBG: No results for input(s): GLUCAP in the last 168 hours. Lipid Profile: No results for input(s): CHOL, HDL, LDLCALC, TRIG, CHOLHDL, LDLDIRECT in the last 72 hours. Thyroid Function Tests: No  results for input(s): TSH, T4TOTAL, FREET4, T3FREE, THYROIDAB in the last 72 hours. Anemia Panel: No results for input(s): VITAMINB12, FOLATE, FERRITIN, TIBC, IRON, RETICCTPCT in the last 72 hours.  --------------------------------------------------------------------------------------------------------------- Urine analysis:    Component Value Date/Time   COLORURINE AMBER (A) 09/27/2018 2151   APPEARANCEUR HAZY (A) 09/27/2018 2151   LABSPEC 1.028 09/27/2018 2151   PHURINE 5.0 09/27/2018 2151   GLUCOSEU NEGATIVE 09/27/2018 2151   HGBUR NEGATIVE 09/27/2018 2151   BILIRUBINUR NEGATIVE 09/27/2018  2151   KETONESUR 5 (A) 09/27/2018 2151   PROTEINUR 30 (A) 09/27/2018 2151   UROBILINOGEN 0.2 10/22/2012 0535   NITRITE NEGATIVE 09/27/2018 2151   LEUKOCYTESUR NEGATIVE 09/27/2018 2151      Imaging Results:    Dg Chest 2 View  Result Date: 09/27/2018 CLINICAL DATA:  Generalized body aches starting 4 weeks ago. Night sweats. Low grade fever for 12 days. Increased fever and diaphoresis today. History of diabetes. EXAM: CHEST - 2 VIEW COMPARISON:  01/03/2007 FINDINGS: The heart size and mediastinal contours are within normal limits. Both lungs are clear. The visualized skeletal structures are unremarkable. IMPRESSION: No active cardiopulmonary disease. Electronically Signed   By: Burman Nieves M.D.   On: 09/27/2018 21:24       Assessment & Plan:    Active Problems:   Hip pain   1. Right hip pain-unclear etiology, will obtain x-ray of the right hip and pelvis.  She might need CT or MRI of the hip based on x-ray results.  Will consult orthopedic surgery in a.m.  2. Fever-unclear etiology, patient has been afebrile in the hospital.  She received IV antibiotics empirically in the ED.  Lactic acid cleared from 3.14-1.17.  I doubt that patient has sepsis.  Initial lactic acid elevated could be due to starvation ketoacidosis.  Patient has ketones in the urine.  Would observe her in the hospital  without antibiotics.  Patient had autoimmune and connective tissue disorder work-up as outpatient.  She will follow-up with her PCP as outpatient.  3. Diabetes mellitus type 2-initial anion gap was 12, start sliding scale insulin with NovoLog.    DVT Prophylaxis-   Lovenox   AM Labs Ordered, also please review Full Orders  Family Communication: Admission, patients condition and plan of care including tests being ordered have been discussed with the patient and her husband at bedside who indicate understanding and agree with the plan and Code Status.  Code Status: Full code  Admission status: Observation: Based on patients clinical presentation and evaluation of above clinical data, I have made determination that patient meets Inpatient criteria at this time.  Patient will need less than 2 midnight stay in the hospital.  Time spent in minutes : 60 minutes   Meredeth Ide M.D on 09/28/2018 at 12:22 AM  Between 7am to 7pm - Pager - [phone]. After 7pm go to [email or web address] - password [facility]   [facility] - Office  [phone]

## 2018-09-28 NOTE — ED Notes (Signed)
Report called to Edgar Frisk, RN on 300.

## 2018-09-28 NOTE — Progress Notes (Signed)
Patient seen and examined this morning, admitted overnight by Dr. Sharl Ma, H&P reviewed and agree with the assessment and plan  In brief, this is a pleasant 38 year old female with history of diabetes who presents to the hospital with 4-week of intermittent fevers as well as joint pains mainly in the right hip right knee and right toe.  She was evaluated as an outpatient few days ago, symptoms persisted with a temp of one 1.7 so she came back to the ED.   Fever of unknown origin -This is been going on for almost a month, no clear-cut etiology.  She was evaluated as an outpatient 3 days ago.  I was able to call her PCPs office in [place] and Dr. Baird Lyons, the nurse practitioner that saw the patient 2 days ago.  She checked at that time a CBC, CMP, Lyme serology, ANA, thyroid and vitamin D levels which were all normal.  An A1c was 6.0.  Rheumatoid factor was borderline elevated. -Obtain CT scan of the abdomen and pelvis as well as right hip, cultures are pending, monitor off antibiotics  Type 2 diabetes mellitus -Continue sliding scale  Right hip pain -CT scan pending  Nausea vomiting, poor p.o. intake -Probably related to #1  Costin M. Elvera Lennox, MD, PhD [facility]  Contact via  [email or web address]  [facility] Office Info P: [phone]  F: [phone]

## 2018-09-28 NOTE — Progress Notes (Signed)
Patient ID: Crystal Mccoy, female   DOB: 1980/10/22, 38 y.o.   MRN: [id number] Back pain (she says hip but points to lower back right side)  + SLR at 60 and + [place] [facility] LB and right buttocks  Fever and pain x 4 weeks   Denies groin pain   Mri l spine

## 2018-09-29 ENCOUNTER — Observation Stay (HOSPITAL_COMMUNITY): Payer: Managed Care, Other (non HMO)

## 2018-09-29 DIAGNOSIS — R509 Fever, unspecified: Secondary | ICD-10-CM

## 2018-09-29 DIAGNOSIS — R6510 Systemic inflammatory response syndrome (SIRS) of non-infectious origin without acute organ dysfunction: Secondary | ICD-10-CM | POA: Diagnosis not present

## 2018-09-29 DIAGNOSIS — M25451 Effusion, right hip: Secondary | ICD-10-CM

## 2018-09-29 DIAGNOSIS — M2550 Pain in unspecified joint: Secondary | ICD-10-CM | POA: Diagnosis not present

## 2018-09-29 LAB — HEPATITIS A ANTIBODY, TOTAL: Hep A Total Ab: NEGATIVE

## 2018-09-29 LAB — COMPREHENSIVE METABOLIC PANEL
ALBUMIN: 3.4 g/dL — AB (ref 3.5–5.0)
ALT: 70 U/L — ABNORMAL HIGH (ref 0–44)
AST: 82 U/L — AB (ref 15–41)
Alkaline Phosphatase: 109 U/L (ref 38–126)
Anion gap: 8 (ref 5–15)
BUN: 6 mg/dL (ref 6–20)
CO2: 25 mmol/L (ref 22–32)
Calcium: 8.7 mg/dL — ABNORMAL LOW (ref 8.9–10.3)
Chloride: 101 mmol/L (ref 98–111)
Creatinine, Ser: 0.57 mg/dL (ref 0.44–1.00)
GFR calc Af Amer: >60 mL/min (ref >60)
GFR calc non Af Amer: >60 mL/min (ref >60)
GLUCOSE: 157 mg/dL — AB (ref 70–99)
Potassium: 4 mmol/L (ref 3.5–5.1)
SODIUM: 134 mmol/L — AB (ref 135–145)
Total Bilirubin: 0.7 mg/dL (ref 0.3–1.2)
Total Protein: 7.1 g/dL (ref 6.5–8.1)

## 2018-09-29 LAB — ANTI-DNA ANTIBODY, DOUBLE-STRANDED: ds DNA Ab: 1 IU/mL (ref 0–9)

## 2018-09-29 LAB — CBC WITH DIFFERENTIAL/PLATELET
Abs Immature Granulocytes: 0.04 10*3/uL (ref 0.00–0.07)
Basophils Absolute: 0 10*3/uL (ref 0.0–0.1)
Basophils Relative: 1 %
Eosinophils Absolute: 0 10*3/uL (ref 0.0–0.5)
Eosinophils Relative: 1 %
HCT: 36.1 % (ref 36.0–46.0)
Hemoglobin: 12 g/dL (ref 12.0–15.0)
Immature Granulocytes: 1 %
LYMPHS ABS: 1.7 10*3/uL (ref 0.7–4.0)
Lymphocytes Relative: 46 %
MCH: 30 pg (ref 26.0–34.0)
MCHC: 33.2 g/dL (ref 30.0–36.0)
MCV: 90.3 fL (ref 80.0–100.0)
Monocytes Absolute: 0.5 10*3/uL (ref 0.1–1.0)
Monocytes Relative: 13 %
Neutro Abs: 1.4 10*3/uL — ABNORMAL LOW (ref 1.7–7.7)
Neutrophils Relative %: 38 %
Platelets: 150 10*3/uL (ref 150–400)
RBC: 4 MIL/uL (ref 3.87–5.11)
RDW: 12.9 % (ref 11.5–15.5)
WBC MORPHOLOGY: REACTIVE
WBC: 3.6 10*3/uL — ABNORMAL LOW (ref 4.0–10.5)
nRBC: 0 % (ref 0.0–0.2)

## 2018-09-29 LAB — HEPATITIS B SURFACE ANTIBODY,QUALITATIVE: Hep B S Ab: REACTIVE

## 2018-09-29 LAB — HCV RNA QUANT: HCV Quantitative: NOT DETECTED IU/mL (ref >50)

## 2018-09-29 LAB — HEPATITIS B SURFACE ANTIGEN: Hepatitis B Surface Ag: NEGATIVE

## 2018-09-29 LAB — GLUCOSE, CAPILLARY
Glucose-Capillary: 133 mg/dL — ABNORMAL HIGH (ref 70–99)
Glucose-Capillary: 202 mg/dL — ABNORMAL HIGH (ref 70–99)
Glucose-Capillary: 158 mg/dL — ABNORMAL HIGH (ref 70–99)
Glucose-Capillary: 161 mg/dL — ABNORMAL HIGH (ref 70–99)

## 2018-09-29 LAB — HIV-1 RNA QUANT-NO REFLEX-BLD
HIV 1 RNA Quant: <20 copies/mL
LOG10 HIV-1 RNA: UNDETERMINED log10copy/mL

## 2018-09-29 LAB — ANTIEXTRACTABLE NUCLEAR AG
ENA SM Ab Ser-aCnc: <0.2 AI (ref 0.0–0.9)
Ribonucleic Protein: 0.4 AI (ref 0.0–0.9)

## 2018-09-29 LAB — HEPATITIS C ANTIBODY: HCV Ab: <0.1 s/co ratio (ref 0.0–0.9)

## 2018-09-29 LAB — C4 COMPLEMENT: Complement C4, Body Fluid: 35 mg/dL (ref 14–44)

## 2018-09-29 LAB — C3 COMPLEMENT: C3 Complement: 174 mg/dL — ABNORMAL HIGH (ref 82–167)

## 2018-09-29 LAB — CMV IGM: CMV IgM: <30 AU/mL (ref 0.0–29.9)

## 2018-09-29 LAB — MPO/PR-3 (ANCA) ANTIBODIES
ANCA Proteinase 3: 5.2 U/mL — ABNORMAL HIGH (ref 0.0–3.5)
Myeloperoxidase Abs: <9 U/mL (ref 0.0–9.0)

## 2018-09-29 LAB — HIV ANTIBODY (ROUTINE TESTING W REFLEX): HIV Screen 4th Generation wRfx: NONREACTIVE

## 2018-09-29 LAB — CMV DNA, QUANTITATIVE, PCR
CMV DNA Quant: NEGATIVE IU/mL
Log10 CMV Qn DNA Pl: UNDETERMINED log10 IU/mL

## 2018-09-29 LAB — EPSTEIN-BARR VIRUS VCA, IGM

## 2018-09-29 MED ORDER — IBUPROFEN 400 MG PO TABS
200.0000 mg | ORAL_TABLET | Freq: Four times a day (QID) | ORAL | Status: DC | PRN
  Administered 2018-09-30: 200 mg via ORAL
  Filled 2018-09-29: qty 1

## 2018-09-29 MED ORDER — IOHEXOL 300 MG/ML  SOLN
75.0000 mL | Freq: Once | INTRAMUSCULAR | Status: AC | PRN
  Administered 2018-09-29: 75 mL via INTRAVENOUS

## 2018-09-29 NOTE — Progress Notes (Signed)
PROGRESS NOTE  YOULANDA TOMASSETTI ZOX:[id number] DOB: 04-03-1981 DOA: 09/27/2018 PCP: System, Pcp Not In   LOS: 0 days   Brief Narrative / Interim history: 38 year old female, nursing student, with history of diabetes who presents to the hospital with 4-week of intermittent fevers as well as joint pains mainly in the right hip right knee and right toe.  She was evaluated as an outpatient few days ago, symptoms persisted with a temp of 101.7 so she came back to the ED.  Subjective: -Her high temperature last night of 103, felt quite poorly with myalgias, chills, broken a sweat.  Afebrile this morning and feeling better.  Blood cultures were obtained while she was febrile  Assessment & Plan: Active Problems:   Hip pain   Principal Problem Fever of unknown origin/SIRS -This is been going on for about a month, other than right hip, right knee and right ankle pain no other systemic symptoms.  Specifically, no cough/chest congestion/shortness of breath, no abdominal pain, nausea vomiting or diarrhea, no dysuria, no URI type symptoms -She was evaluated as an outpatient 3 days prior to this hospitalization, I have discussed with nurse practitioner in [place] who evaluated patient, and at that time her CBC, CMP, Lyme serology, ANA, thyroid function tests and vitamin D levels were normal.  Rheumatoid factor was borderline elevated. -I have discussed case with infectious disease Dr. Orvan Falconer over the phone, and will start FUO work-up, if she is clinically stable she may be discharged from the hospital once blood cultures remain negative and will be evaluated in ID clinic as early as possible -She underwent CT scan of the abdomen and pelvis as well as right hip which was negative for acute findings, also underwent MRI of the lumbar spine which was negative for acute findings -Blood work is essentially normal except for very mildly elevated LFTs -Have sent autoimmune work-up with ENA panel --pending,  CRP and sed rate are elevated, MPO/PR-3 --pending, anti-DNA --pending, C3 and C4 complement unremarkable -Have sent infectious work-up with CMV IgM negative, DNA PCR --pending, EBV and parvovirus --pending, HIV antibodies negative but RNA PCR pending, hep B potential negative, hep C pending -Ferritin slightly elevated but likely an acute phase reactant -SPEP pending -Cultures obtained while febrile last night to 103, no growth to date.  She was off antibiotics and will continue to monitor off antibiotics without a clear source  --Patient is still febrile at high risk of deterioration, no clear source identified yet, will require ongoing inpatient monitoring  Active Problems Elevated LFTs -Borderline elevated, monitor hepatitis panel.  May be slightly up in the setting of SIRS  Diabetes mellitus -Hemoglobin A1c 6.1, continue sliding scale  Scheduled Meds: . enoxaparin (LOVENOX) injection  40 mg Subcutaneous Q24H  . insulin aspart  0-9 Units Subcutaneous TID WC  . venlafaxine XR  75 mg Oral Q breakfast   Continuous Infusions: . sodium chloride 75 mL/hr at 09/28/18 0558   PRN Meds:.acetaminophen, diphenhydrAMINE, ibuprofen, ondansetron **OR** ondansetron (ZOFRAN) IV  DVT prophylaxis: Lovenox Code Status: Full code Family Communication: No family present at bedside Disposition Plan: Home likely on Monday after blood cultures are negative for 24 hours,  Consultants:   Orthopedic surgery  Procedures:   None   Antimicrobials:  None   Received Vanco, cefepime, Flagyl x1 in the ED on 1/9  Objective: Vitals:   09/28/18 0529 09/28/18 2052 09/28/18 2124 09/29/18 0536  BP: 138/84 129/70  112/69  Pulse: (!) 110 (!) 108  97  Resp:  20  20  Temp:  98.5 F (36.9 C)  98.9 F (37.2 C)  TempSrc:    Oral  SpO2: 100% 98% 95% 100%  Weight: 95.2 kg     Height:        Intake/Output Summary (Last 24 hours) at 09/29/2018 1121 Last data filed at 09/29/2018 0900 Gross per 24 hour    Intake 60 ml  Output -  Net 60 ml   Filed Weights   09/27/18 1949 09/28/18 0159 09/28/18 0529  Weight: 95.3 kg 95.2 kg 95.2 kg    Examination:  Constitutional: NAD, flushed face Eyes: PERRL, lids and conjunctivae normal ENMT: Mucous membranes are moist. No oropharyngeal exudates Neck: normal, supple, no masses, no thyromegaly Respiratory: clear to auscultation bilaterally, no wheezing, no crackles. Normal respiratory effort. No accessory muscle use.  Cardiovascular: Regular rate and rhythm, no murmurs / rubs / gallops. No LE edema. 2+ pedal pulses. No carotid bruits.  Abdomen: no tenderness. Bowel sounds positive.  Musculoskeletal: no clubbing / cyanosis. No joint deformity upper and lower extremities. No contractures. Normal muscle tone.  Skin: no rashes, lesions, ulcers. No induration Neurologic: CN 2-12 grossly intact. Strength 5/5 in all 4.  Psychiatric: Normal judgment and insight. Alert and oriented x 3. Normal mood.    Data Reviewed: I have independently reviewed following labs and imaging studies   CBC: Recent Labs  Lab 09/27/18 2057 09/28/18 0602 09/29/18 0640  WBC 4.7 3.6* 3.6*  NEUTROABS 2.1  --  1.4*  HGB 12.9 12.6 12.0  HCT 39.1 38.6 36.1  MCV 87.9 90.4 90.3  PLT 176 166 150   Basic Metabolic Panel: Recent Labs  Lab 09/27/18 2057 09/28/18 0602 09/29/18 0640  NA 131* 134* 134*  K 4.0 3.8 4.0  CL 97* 99 101  CO2 22 26 25   GLUCOSE 136* 138* 157*  BUN 10 9 6   CREATININE 0.69 0.57 0.57  CALCIUM 9.1 8.7* 8.7*   GFR: Estimated Creatinine Clearance: 109.9 mL/min (by C-G formula based on SCr of 0.57 mg/dL). Liver Function Tests: Recent Labs  Lab 09/27/18 2057 09/28/18 0602 09/29/18 0640  AST 41 81* 82*  ALT 30 46* 70*  ALKPHOS 88 115 109  BILITOT 0.6 0.8 0.7  PROT 7.8 7.5 7.1  ALBUMIN 3.9 3.7 3.4*   No results for input(s): LIPASE, AMYLASE in the last 168 hours. No results for input(s): AMMONIA in the last 168 hours. Coagulation  Profile: No results for input(s): INR, PROTIME in the last 168 hours. Cardiac Enzymes: Recent Labs  Lab 09/27/18 2047 09/28/18 1701  CKTOTAL 12* 21*   BNP (last 3 results) No results for input(s): PROBNP in the last 8760 hours. HbA1C: Recent Labs    09/28/18 0602  HGBA1C 6.1*   CBG: Recent Labs  Lab 09/28/18 0752 09/28/18 1206 09/28/18 1628 09/28/18 2053 09/29/18 0749  GLUCAP 160* 135* 231* 224* 133*   Lipid Profile: No results for input(s): CHOL, HDL, LDLCALC, TRIG, CHOLHDL, LDLDIRECT in the last 72 hours. Thyroid Function Tests: No results for input(s): TSH, T4TOTAL, FREET4, T3FREE, THYROIDAB in the last 72 hours. Anemia Panel: Recent Labs    09/28/18 1613  FERRITIN 315*   Urine analysis:    Component Value Date/Time   COLORURINE AMBER (A) 09/27/2018 2151   APPEARANCEUR HAZY (A) 09/27/2018 2151   LABSPEC 1.028 09/27/2018 2151   PHURINE 5.0 09/27/2018 2151   GLUCOSEU NEGATIVE 09/27/2018 2151   HGBUR NEGATIVE 09/27/2018 2151   BILIRUBINUR NEGATIVE 09/27/2018 2151   KETONESUR 5 (A)  09/27/2018 2151   PROTEINUR 30 (A) 09/27/2018 2151   UROBILINOGEN 0.2 10/22/2012 0535   NITRITE NEGATIVE 09/27/2018 2151   LEUKOCYTESUR NEGATIVE 09/27/2018 2151   Sepsis Labs: Invalid input(s): PROCALCITONIN, LACTICIDVEN  Recent Results (from the past 240 hour(s))  Blood Culture (routine x 2)     Status: None (Preliminary result)   Collection Time: 09/27/18  8:57 PM  Result Value Ref Range Status   Specimen Description BLOOD RIGHT ANTECUBITAL  Final   Special Requests   Final    BOTTLES DRAWN AEROBIC AND ANAEROBIC Blood Culture adequate volume   Culture   Final    NO GROWTH 2 DAYS Performed at [facility], [address], [place], [place] [address]    Report Status PENDING  Incomplete  Blood Culture (routine x 2)     Status: None (Preliminary result)   Collection Time: 09/27/18  9:23 PM  Result Value Ref Range Status   Specimen Description BLOOD LEFT ANTECUBITAL  Final    Special Requests   Final    BOTTLES DRAWN AEROBIC AND ANAEROBIC Blood Culture adequate volume   Culture   Final    NO GROWTH 2 DAYS Performed at [facility], [address], [place], [place] [address]    Report Status PENDING  Incomplete  Culture, blood (Routine X 2) w Reflex to ID Panel     Status: None (Preliminary result)   Collection Time: 09/28/18  7:19 PM  Result Value Ref Range Status   Specimen Description BLOOD RIGHT HAND  Final   Special Requests   Final    BOTTLES DRAWN AEROBIC AND ANAEROBIC Blood Culture adequate volume   Culture   Final    NO GROWTH < 12 HOURS Performed at [facility], [address], [place], [place] [address]    Report Status PENDING  Incomplete  Culture, blood (Routine X 2) w Reflex to ID Panel     Status: None (Preliminary result)   Collection Time: 09/28/18  7:28 PM  Result Value Ref Range Status   Specimen Description BLOOD LEFT ARM  Final   Special Requests   Final    BOTTLES DRAWN AEROBIC AND ANAEROBIC Blood Culture adequate volume   Culture   Final    NO GROWTH < 12 HOURS Performed at [facility], [address] [place], [place] [address]    Report Status PENDING  Incomplete      Radiology Studies: Dg Chest 2 View  Result Date: 09/27/2018 CLINICAL DATA:  Generalized body aches starting 4 weeks ago. Night sweats. Low grade fever for 12 days. Increased fever and diaphoresis today. History of diabetes. EXAM: CHEST - 2 VIEW COMPARISON:  01/03/2007 FINDINGS: The heart size and mediastinal contours are within normal limits. Both lungs are clear. The visualized skeletal structures are unremarkable. IMPRESSION: No active cardiopulmonary disease. Electronically Signed   By: Burman Nieves M.D.   On: 09/27/2018 21:24   Mr Lumbar Spine Wo Contrast  Result Date: 09/28/2018 CLINICAL DATA:  Initial evaluation for lower back pain radiating into the right lower extremity for 2 weeks. EXAM: MRI LUMBAR SPINE WITHOUT CONTRAST TECHNIQUE: Multiplanar,  multisequence MR imaging of the lumbar spine was performed. No intravenous contrast was administered. COMPARISON:  None. FINDINGS: Segmentation: Standard. Lowest well-formed disc labeled the L5-S1 level. Alignment: Vertebral bodies normally aligned with preservation of the normal lumbar lordosis. No listhesis. Vertebrae: Vertebral body heights well maintained without evidence for acute or chronic fracture. Bone marrow signal intensity diffusely decreased on T1 weighted imaging, most  commonly related to anemia, smoking, or obesity. No discrete or worrisome osseous lesions. No abnormal marrow edema. Conus medullaris and cauda equina: Conus extends to the L1-2 level. Conus and cauda equina appear normal. Paraspinal and other soft tissues: Paraspinous soft tissues within normal limits. Visualized visceral structures unremarkable. Disc levels: No significant disc pathology seen within the lumbar spine. Intervertebral discs are well hydrated with preserved disc height. No significant disc bulge or disc protrusion. No significant facet degeneration. Epidural lipomatosis noted at the L5-S1 level with mild compression of the distal thecal sac. No other significant stenosis. No evidence for neural impingement. IMPRESSION: 1. 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 2. Otherwise unremarkable and normal MRI of the lumbar spine. No significant disc pathology or stenosis. No neural impingement. Electronically Signed   By: Rise MuBenjamin  McClintock M.D.   On: 09/28/2018 15:56   Ct Abdomen Pelvis W Contrast  Addendum Date: 09/28/2018   ADDENDUM REPORT: 09/28/2018 17:53 ADDENDUM: Correction: Patient does have adenopathy in the upper abdomen around the gastroesophageal junction and between the portal vein and inferior vena cava where there is a 52 x 35 x 19 mm lymph node. There is a 12 mm lymph node near the hepatic hilum. These are nonspecific. The possibility of lymphoma should be  considered. Electronically Signed   By: Francene BoyersJames  Maxwell M.D.   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 There are no arthritic changes of significance in the right hip. No joint effusion. Adjacent soft tissues appear normal. Electronically Signed   By: Francene BoyersJames  Maxwell M.D.   On: 09/28/2018 12:54   Result Date: 09/28/2018 CLINICAL DATA:  Abdominal pain and fever. EXAM: CT ABDOMEN AND PELVIS WITH CONTRAST TECHNIQUE: Multidetector CT imaging of the abdomen and pelvis was performed using the standard protocol following bolus administration of intravenous contrast. CONTRAST:  100mL ISOVUE-300 IOPAMIDOL (ISOVUE-300) INJECTION 61% COMPARISON:  Chest x-ray dated 01/03/2007 FINDINGS: Lower chest: Normal. Hepatobiliary: No focal liver abnormality is seen. No gallstones, gallbladder wall thickening, or biliary dilatation. Pancreas: Unremarkable. No pancreatic ductal dilatation or surrounding inflammatory changes. Spleen: Normal in size without focal abnormality. Adrenals/Urinary Tract: Adrenal glands are unremarkable. Kidneys are normal, without renal calculi, focal lesion, or hydronephrosis. Bladder is unremarkable. Stomach/Bowel: Stomach is within normal limits. Appendix appears normal. No evidence of bowel wall thickening, distention, or inflammatory changes. Vascular/Lymphatic: No significant vascular findings are present. No enlarged abdominal or pelvic lymph nodes. Reproductive: Uterus and bilateral adnexa are unremarkable. Other: No abdominal wall hernia or abnormality. No abdominopelvic ascites. Musculoskeletal: There are numerous tiny sclerotic lesions in the bones of the pelvis and in the proximal femurs. There is suggestion of a few tiny sclerotic lesions in the. Bones otherwise appear  essentially normal. IMPRESSION: 1. No acute abnormalities of the abdomen or pelvis. 2. Numerous tiny sclerotic lesions in the bones of the pelvis and proximal femurs, nonspecific. Review of the prior chest x-ray from 2008 suggests tiny sclerotic lesions in the bones of the shoulders bilaterally. 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 Electronically Signed: By: Francene BoyersJames  Maxwell M.D. On: 09/28/2018 12:49   Ct No Charge  Addendum Date: 09/28/2018   ADDENDUM REPORT: 09/28/2018 17:53 ADDENDUM: Correction: Patient does have adenopathy in the upper abdomen around the gastroesophageal junction and between the portal vein and  inferior vena cava where there is a 52 x 35 x 19 mm lymph node. There is a 12 mm lymph node near the hepatic hilum. These are nonspecific. The possibility of lymphoma should be considered. Electronically Signed   By: Francene Boyers M.D.   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 There are no arthritic changes of significance in the right hip. No joint effusion. Adjacent soft tissues appear normal. Electronically Signed   By: Francene Boyers M.D.   On: 09/28/2018 12:54   Result Date: 09/28/2018 CLINICAL DATA:  Abdominal pain and fever. EXAM: CT ABDOMEN AND PELVIS WITH CONTRAST TECHNIQUE: Multidetector CT imaging of the abdomen and pelvis was performed using the standard protocol following bolus administration of intravenous contrast. CONTRAST:  ISOVUE-300 IOPAMIDOL (ISOVUE-300) INJECTION 61% COMPARISON:  Chest x-ray dated 01/03/2007 FINDINGS: Lower chest: Normal. Hepatobiliary: No focal liver abnormality is seen. No gallstones, gallbladder wall thickening, or biliary dilatation. Pancreas: Unremarkable. No pancreatic ductal dilatation or surrounding inflammatory  changes. Spleen: Normal in size without focal abnormality. Adrenals/Urinary Tract: Adrenal glands are unremarkable. Kidneys are normal, without renal calculi, focal lesion, or hydronephrosis. Bladder is unremarkable. Stomach/Bowel: Stomach is within normal limits. Appendix appears normal. No evidence of bowel wall thickening, distention, or inflammatory changes. Vascular/Lymphatic: No significant vascular findings are present. No enlarged abdominal or pelvic lymph nodes. Reproductive: Uterus and bilateral adnexa are unremarkable. Other: No abdominal wall hernia or abnormality. No abdominopelvic ascites. Musculoskeletal: There are numerous tiny sclerotic lesions in the bones of the pelvis and in the proximal femurs. There is suggestion of a few tiny sclerotic lesions in the. Bones otherwise appear essentially normal. IMPRESSION: 1. No acute abnormalities of the abdomen or pelvis. 2. Numerous tiny sclerotic lesions in the bones of the pelvis and proximal femurs, nonspecific. Review of the prior chest x-ray from 2008 suggests tiny sclerotic lesions in the bones of the shoulders bilaterally. 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 Electronically Signed: By: Francene Boyers M.D. On: 09/28/2018 12:49   Dg Hip Unilat With Pelvis 2-3 Views Right  Result Date: 09/28/2018 CLINICAL DATA:  Hip pain EXAM: DG HIP (WITH OR WITHOUT PELVIS) 2-3V RIGHT COMPARISON:  None. FINDINGS: There is no evidence of hip fracture or dislocation. There is no evidence of arthropathy or other focal bone abnormality. IMPRESSION: Negative. Electronically Signed   By: Jasmine Pang M.D.   On: 09/28/2018 01:11     Pamella Pert, MD, PhD [facility]  Contact via  [email or web address]  [facility] Office Info P: [phone]  F: [phone]

## 2018-09-30 ENCOUNTER — Observation Stay (HOSPITAL_BASED_OUTPATIENT_CLINIC_OR_DEPARTMENT_OTHER): Payer: Managed Care, Other (non HMO)

## 2018-09-30 DIAGNOSIS — R011 Cardiac murmur, unspecified: Secondary | ICD-10-CM

## 2018-09-30 DIAGNOSIS — R509 Fever, unspecified: Secondary | ICD-10-CM | POA: Diagnosis not present

## 2018-09-30 LAB — GLUCOSE, CAPILLARY
Glucose-Capillary: 152 mg/dL — ABNORMAL HIGH (ref 70–99)
Glucose-Capillary: 205 mg/dL — ABNORMAL HIGH (ref 70–99)
Glucose-Capillary: 147 mg/dL — ABNORMAL HIGH (ref 70–99)
Glucose-Capillary: 153 mg/dL — ABNORMAL HIGH (ref 70–99)

## 2018-09-30 LAB — CBC WITH DIFFERENTIAL/PLATELET
Abs Immature Granulocytes: 0.05 10*3/uL (ref 0.00–0.07)
Basophils Absolute: 0.1 10*3/uL (ref 0.0–0.1)
Basophils Relative: 1 %
Eosinophils Absolute: 0 10*3/uL (ref 0.0–0.5)
Eosinophils Relative: 1 %
HCT: 36.8 % (ref 36.0–46.0)
Hemoglobin: 12.1 g/dL (ref 12.0–15.0)
Immature Granulocytes: 1 %
Lymphocytes Relative: 52 %
Lymphs Abs: 2.5 10*3/uL (ref 0.7–4.0)
MCH: 29.4 pg (ref 26.0–34.0)
MCHC: 32.9 g/dL (ref 30.0–36.0)
MCV: 89.5 fL (ref 80.0–100.0)
Monocytes Absolute: 0.7 10*3/uL (ref 0.1–1.0)
Monocytes Relative: 14 %
NRBC: 0 % (ref 0.0–0.2)
Neutro Abs: 1.5 10*3/uL — ABNORMAL LOW (ref 1.7–7.7)
Neutrophils Relative %: 31 %
PLATELETS: 177 10*3/uL (ref 150–400)
RBC: 4.11 MIL/uL (ref 3.87–5.11)
RDW: 13.1 % (ref 11.5–15.5)
WBC: 4.9 10*3/uL (ref 4.0–10.5)

## 2018-09-30 LAB — COMPREHENSIVE METABOLIC PANEL
ALT: 82 U/L — ABNORMAL HIGH (ref 0–44)
AST: 88 U/L — ABNORMAL HIGH (ref 15–41)
Albumin: 3.6 g/dL (ref 3.5–5.0)
Alkaline Phosphatase: 143 U/L — ABNORMAL HIGH (ref 38–126)
Anion gap: 11 (ref 5–15)
BUN: 9 mg/dL (ref 6–20)
CO2: 22 mmol/L (ref 22–32)
Calcium: 8.6 mg/dL — ABNORMAL LOW (ref 8.9–10.3)
Chloride: 98 mmol/L (ref 98–111)
Creatinine, Ser: 0.55 mg/dL (ref 0.44–1.00)
GFR calc Af Amer: >60 mL/min (ref >60)
Glucose, Bld: 151 mg/dL — ABNORMAL HIGH (ref 70–99)
Potassium: 3.7 mmol/L (ref 3.5–5.1)
Sodium: 131 mmol/L — ABNORMAL LOW (ref 135–145)
Total Bilirubin: 1.2 mg/dL (ref 0.3–1.2)
Total Protein: 7.7 g/dL (ref 6.5–8.1)

## 2018-09-30 LAB — ECHOCARDIOGRAM COMPLETE
Height: 65 in
WEIGHTICAEL: 3358.05 [oz_av]

## 2018-09-30 LAB — EPSTEIN BARR VRS(EBV DNA BY PCR)
EBV DNA QN by PCR: 419 copies/mL
log10 EBV DNA Qn PCR: 2.622 log10 copy/mL

## 2018-09-30 MED ORDER — SENNOSIDES-DOCUSATE SODIUM 8.6-50 MG PO TABS
1.0000 | ORAL_TABLET | Freq: Two times a day (BID) | ORAL | Status: DC
  Administered 2018-09-30 – 2018-10-01 (×3): 1 via ORAL
  Filled 2018-09-30 (×3): qty 1

## 2018-09-30 MED ORDER — NAPHAZOLINE-GLYCERIN 0.012-0.2 % OP SOLN
1.0000 [drp] | Freq: Four times a day (QID) | OPHTHALMIC | Status: DC | PRN
  Filled 2018-09-30: qty 15

## 2018-09-30 NOTE — Progress Notes (Signed)
PROGRESS NOTE  Crystal CivatteDebra L Mccoy VOZ:[id number] DOB: 04/29/1981 DOA: 09/27/2018 PCP: System, Pcp Not In   LOS: 0 days   Brief Narrative / Interim history: 38 year old female, nursing student, with history of diabetes who presents to the hospital with 4-week of intermittent fevers as well as joint pains mainly in the right hip right knee and right toe.  She was evaluated as an outpatient few days ago, symptoms persisted with a temp of 101.7 so she came back to the ED.  Subjective: -Her high temperature last night of 103, felt quite poorly with myalgias, chills, broken a sweat.  Afebrile this morning and feeling better.  Blood cultures were obtained while she was febrile  Assessment & Plan: Active Problems:   Hip pain   Principal Problem Fever of unknown origin/SIRS -This is been going on for about a month, other than right hip, right knee and right ankle pain no other systemic symptoms.  Specifically, no cough/chest congestion/shortness of breath, no abdominal pain, nausea vomiting or diarrhea, no dysuria, no URI type symptoms -She was evaluated as an outpatient 3 days prior to this hospitalization, I have discussed with nurse practitioner in [place] who evaluated patient, and at that time her CBC, CMP, Lyme serology, ANA, thyroid function tests and vitamin D levels were normal.  Rheumatoid factor was borderline elevated. -I have discussed case with infectious disease Dr. Orvan Falconerampbell over the phone, and will start FUO work-up, if she is clinically stable she may be discharged from the hospital once blood cultures remain negative and will be evaluated in ID clinic as early as possible -She underwent CT scan of the abdomen and pelvis as well as right hip which was negative for acute findings, also underwent MRI of the lumbar spine which was negative for acute findings -Blood work is essentially normal except for very mildly elevated LFTs -Have sent autoimmune work-up with ENA panel --pending,  CRP and sed rate are elevated, MPO/PR-3 --pending, anti-DNA --pending, C3 and C4 complement unremarkable -Have sent infectious work-up with CMV IgM negative, DNA PCR --pending, EBV and parvovirus --pending, HIV antibodies negative but RNA PCR pending, hep B potential negative, hep C pending -Ferritin slightly elevated but likely an acute phase reactant -SPEP pending -Cultures obtained while febrile last night to 103, no growth to date.  She was off antibiotics and will continue to monitor off antibiotics without a clear source  --Patient is still febrile at high risk of deterioration, no clear source identified yet, will require ongoing inpatient monitoring  -CT scan of the chest yesterday with single lymphadenopathy, discussed with IR, high risk of bleeding if it were to be biopsied, recommended PET CT scan which can be done as an outpatient.  Obtain a 2D echo to rule out endocarditis  Active Problems Elevated LFTs -Borderline elevated, monitor hepatitis panel.  May be slightly up in the setting of SIRS -LFTs stable  Diabetes mellitus -Hemoglobin A1c 6.1, continue sliding scale  Scheduled Meds: . enoxaparin (LOVENOX) injection  40 mg Subcutaneous Q24H  . insulin aspart  0-9 Units Subcutaneous TID WC  . senna-docusate  1 tablet Oral BID  . venlafaxine XR  75 mg Oral Q breakfast   Continuous Infusions:  PRN Meds:.acetaminophen, diphenhydrAMINE, ibuprofen, ondansetron **OR** ondansetron (ZOFRAN) IV  DVT prophylaxis: Lovenox Code Status: Full code Family Communication: Husband at bedside Disposition Plan: Home likely on Monday after blood cultures are negative for 48 hours  Consultants:   Orthopedic surgery  Procedures:   None   Antimicrobials:  None   Received Vanco, cefepime, Flagyl x1 in the ED on 1/9  Objective: Vitals:   09/29/18 1641 09/29/18 2124 09/29/18 2236 09/30/18 0432  BP:  139/61  134/68  Pulse:  93  100  Resp:  16  18  Temp: 99.5 F (37.5 C) 98.1 F  (36.7 C)  (!) 100.7 F (38.2 C)  TempSrc: Oral Oral  Oral  SpO2:  100% 98% 98%  Weight:      Height:       No intake or output data in the 24 hours ending 09/30/18 1039 Filed Weights   09/27/18 1949 09/28/18 0159 09/28/18 0529  Weight: 95.3 kg 95.2 kg 95.2 kg    Examination:  Constitutional: NAD Respiratory: CTA Cardiovascular: RRR  Data Reviewed: I have independently reviewed following labs and imaging studies   CBC: Recent Labs  Lab 09/27/18 2057 09/28/18 0602 09/29/18 0640 09/30/18 0758  WBC 4.7 3.6* 3.6* 4.9  NEUTROABS 2.1  --  1.4* 1.5*  HGB 12.9 12.6 12.0 12.1  HCT 39.1 38.6 36.1 36.8  MCV 87.9 90.4 90.3 89.5  PLT 176 166 150 177   Basic Metabolic Panel: Recent Labs  Lab 09/27/18 2057 09/28/18 0602 09/29/18 0640 09/30/18 0758  NA 131* 134* 134* 131*  K 4.0 3.8 4.0 3.7  CL 97* 99 101 98  CO2 22 26 25 22   GLUCOSE 136* 138* 157* 151*  BUN 10 9 6 9   CREATININE 0.69 0.57 0.57 0.55  CALCIUM 9.1 8.7* 8.7* 8.6*   GFR: Estimated Creatinine Clearance: 109.9 mL/min (by C-G formula based on SCr of 0.55 mg/dL). Liver Function Tests: Recent Labs  Lab 09/27/18 2057 09/28/18 0602 09/29/18 0640 09/30/18 0758  AST 41 81* 82* 88*  ALT 30 46* 70* 82*  ALKPHOS 88 115 109 143*  BILITOT 0.6 0.8 0.7 1.2  PROT 7.8 7.5 7.1 7.7  ALBUMIN 3.9 3.7 3.4* 3.6   No results for input(s): LIPASE, AMYLASE in the last 168 hours. No results for input(s): AMMONIA in the last 168 hours. Coagulation Profile: No results for input(s): INR, PROTIME in the last 168 hours. Cardiac Enzymes: Recent Labs  Lab 09/27/18 2047 09/28/18 1701  CKTOTAL 12* 21*   BNP (last 3 results) No results for input(s): PROBNP in the last 8760 hours. HbA1C: Recent Labs    09/28/18 0602  HGBA1C 6.1*   CBG: Recent Labs  Lab 09/29/18 0749 09/29/18 1138 09/29/18 1626 09/29/18 2123 09/30/18 0738  GLUCAP 133* 202* 158* 161* 152*   Lipid Profile: No results for input(s): CHOL, HDL,  LDLCALC, TRIG, CHOLHDL, LDLDIRECT in the last 72 hours. Thyroid Function Tests: No results for input(s): TSH, T4TOTAL, FREET4, T3FREE, THYROIDAB in the last 72 hours. Anemia Panel: Recent Labs    09/28/18 1613  FERRITIN 315*   Urine analysis:    Component Value Date/Time   COLORURINE AMBER (A) 09/27/2018 2151   APPEARANCEUR HAZY (A) 09/27/2018 2151   LABSPEC 1.028 09/27/2018 2151   PHURINE 5.0 09/27/2018 2151   GLUCOSEU NEGATIVE 09/27/2018 2151   HGBUR NEGATIVE 09/27/2018 2151   BILIRUBINUR NEGATIVE 09/27/2018 2151   KETONESUR 5 (A) 09/27/2018 2151   PROTEINUR 30 (A) 09/27/2018 2151   UROBILINOGEN 0.2 10/22/2012 0535   NITRITE NEGATIVE 09/27/2018 2151   LEUKOCYTESUR NEGATIVE 09/27/2018 2151   Sepsis Labs: Invalid input(s): PROCALCITONIN, LACTICIDVEN  Recent Results (from the past 240 hour(s))  Blood Culture (routine x 2)     Status: None (Preliminary result)   Collection Time: 09/27/18  8:57 PM  Result Value Ref Range Status   Specimen Description BLOOD RIGHT ANTECUBITAL  Final   Special Requests   Final    BOTTLES DRAWN AEROBIC AND ANAEROBIC Blood Culture adequate volume   Culture   Final    NO GROWTH 3 DAYS Performed at [facility], [address] [place], [place] [address]    Report Status PENDING  Incomplete  Blood Culture (routine x 2)     Status: None (Preliminary result)   Collection Time: 09/27/18  9:23 PM  Result Value Ref Range Status   Specimen Description BLOOD LEFT ANTECUBITAL  Final   Special Requests   Final    BOTTLES DRAWN AEROBIC AND ANAEROBIC Blood Culture adequate volume   Culture   Final    NO GROWTH 3 DAYS Performed at [facility], [address], [place], [place] [address]    Report Status PENDING  Incomplete  Culture, blood (Routine X 2) w Reflex to ID Panel     Status: None (Preliminary result)   Collection Time: 09/28/18  7:19 PM  Result Value Ref Range Status   Specimen Description BLOOD RIGHT HAND  Final   Special Requests   Final     BOTTLES DRAWN AEROBIC AND ANAEROBIC Blood Culture adequate volume   Culture   Final    NO GROWTH 2 DAYS Performed at [facility], [address] [place], [place] [address]    Report Status PENDING  Incomplete  Culture, blood (Routine X 2) w Reflex to ID Panel     Status: None (Preliminary result)   Collection Time: 09/28/18  7:28 PM  Result Value Ref Range Status   Specimen Description BLOOD LEFT ARM  Final   Special Requests   Final    BOTTLES DRAWN AEROBIC AND ANAEROBIC Blood Culture adequate volume   Culture   Final    NO GROWTH 2 DAYS Performed at [facility], [address] [place], [place] [address]    Report Status PENDING  Incomplete      Radiology Studies: Ct Chest W Contrast  Result Date: 09/29/2018 CLINICAL DATA:  Fever of unknown origin x 1 month, with joint pain. Hx of diabetes . Adenopathy seen on abdomen/pelvis CT. EXAM: CT CHEST WITH CONTRAST TECHNIQUE: Multidetector CT imaging of the chest was performed during intravenous contrast administration. CONTRAST:  75mL OMNIPAQUE IOHEXOL 300 MG/ML  SOLN COMPARISON:  CT abdomen from the previous day FINDINGS: Cardiovascular: Heart size normal. Trace pericardial fluid. Thoracic aorta is nonaneurysmal. Mediastinum/Nodes: Single prominent 10 mm anterior mediastinal lymph node. No other mediastinal, hilar or axillary adenopathy. Lungs/Pleura: No pleural effusion. No pneumothorax. Lungs are clear. Upper Abdomen: Stable gastrohepatic and porta hepatis nodes. No acute findings. Musculoskeletal: No chest wall abnormality. No acute or significant osseous findings. IMPRESSION: 1. Single prominent 10 mm anterior mediastinal lymph node, nonspecific. 2. Otherwise negative exam. Electronically Signed   By: Corlis Leak M.D.   On: 09/29/2018 11:32   Mr Lumbar Spine Wo Contrast  Result Date: 09/28/2018 CLINICAL DATA:  Initial evaluation for lower back pain radiating into the right lower extremity for 2 weeks. EXAM: MRI LUMBAR SPINE WITHOUT  CONTRAST TECHNIQUE: Multiplanar, multisequence MR imaging of the lumbar spine was performed. No intravenous contrast was administered. COMPARISON:  None. FINDINGS: Segmentation: Standard. Lowest well-formed disc labeled the L5-S1 level. Alignment: Vertebral bodies normally aligned with preservation of the normal lumbar lordosis. No listhesis. Vertebrae: Vertebral body heights well maintained without evidence for acute or chronic fracture. Bone marrow signal intensity diffusely decreased on T1  weighted imaging, most commonly related to anemia, smoking, or obesity. No discrete or worrisome osseous lesions. No abnormal marrow edema. Conus medullaris and cauda equina: Conus extends to the L1-2 level. Conus and cauda equina appear normal. Paraspinal and other soft tissues: Paraspinous soft tissues within normal limits. Visualized visceral structures unremarkable. Disc levels: No significant disc pathology seen within the lumbar spine. Intervertebral discs are well hydrated with preserved disc height. No significant disc bulge or disc protrusion. No significant facet degeneration. Epidural lipomatosis noted at the L5-S1 level with mild compression of the distal thecal sac. No other significant stenosis. No evidence for neural impingement. IMPRESSION: 1. 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 2. Otherwise unremarkable and normal MRI of the lumbar spine. No significant disc pathology or stenosis. No neural impingement. Electronically Signed   By: Rise Mu M.D.   On: 09/28/2018 15:56   Ct Abdomen Pelvis W Contrast  Addendum Date: 09/28/2018   ADDENDUM REPORT: 09/28/2018 17:53 ADDENDUM: Correction: Patient does have adenopathy in the upper abdomen around the gastroesophageal junction and between the portal vein and inferior vena cava where there is a 52 x 35 x 19 mm lymph node. There is a 12 mm lymph node near the hepatic hilum. These are nonspecific. The  possibility of lymphoma should be considered. Electronically Signed   By: Francene Boyers M.D.   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 There are no arthritic changes of significance in the right hip. No joint effusion. Adjacent soft tissues appear normal. Electronically Signed   By: Francene Boyers M.D.   On: 09/28/2018 12:54   Result Date: 09/28/2018 CLINICAL DATA:  Abdominal pain and fever. EXAM: CT ABDOMEN AND PELVIS WITH CONTRAST TECHNIQUE: Multidetector CT imaging of the abdomen and pelvis was performed using the standard protocol following bolus administration of intravenous contrast. CONTRAST:  ISOVUE-300 IOPAMIDOL (ISOVUE-300) INJECTION 61% COMPARISON:  Chest x-ray dated 01/03/2007 FINDINGS: Lower chest: Normal. Hepatobiliary: No focal liver abnormality is seen. No gallstones, gallbladder wall thickening, or biliary dilatation. Pancreas: Unremarkable. No pancreatic ductal dilatation or surrounding inflammatory changes. Spleen: Normal in size without focal abnormality. Adrenals/Urinary Tract: Adrenal glands are unremarkable. Kidneys are normal, without renal calculi, focal lesion, or hydronephrosis. Bladder is unremarkable. Stomach/Bowel: Stomach is within normal limits. Appendix appears normal. No evidence of bowel wall thickening, distention, or inflammatory changes. Vascular/Lymphatic: No significant vascular findings are present. No enlarged abdominal or pelvic lymph nodes. Reproductive: Uterus and bilateral adnexa are unremarkable. Other: No abdominal wall hernia or abnormality. No abdominopelvic ascites. Musculoskeletal: There are numerous tiny sclerotic lesions in the bones of the pelvis and in the proximal femurs. There is suggestion of a few tiny sclerotic lesions in  the. Bones otherwise appear essentially normal. IMPRESSION: 1. No acute abnormalities of the abdomen or pelvis. 2. Numerous tiny sclerotic lesions in the bones of the pelvis and proximal femurs, nonspecific. Review of the prior chest x-ray from 2008 suggests tiny sclerotic lesions in the bones of the shoulders bilaterally. 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 Electronically Signed: By: Francene Boyers M.D. On: 09/28/2018 12:49   Ct No Charge  Addendum Date: 09/28/2018   ADDENDUM REPORT: 09/28/2018 17:53 ADDENDUM: Correction: Patient does have adenopathy in the upper abdomen around the gastroesophageal junction and between the  portal vein and inferior vena cava where there is a 52 x 35 x 19 mm lymph node. There is a 12 mm lymph node near the hepatic hilum. These are nonspecific. The possibility of lymphoma should be considered. Electronically Signed   By: Francene Boyers M.D.   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 There are no arthritic changes of significance in the right hip. No joint effusion. Adjacent soft tissues appear normal. Electronically Signed   By: Francene Boyers M.D.   On: 09/28/2018 12:54   Result Date: 09/28/2018 CLINICAL DATA:  Abdominal pain and fever. EXAM: CT ABDOMEN AND PELVIS WITH CONTRAST TECHNIQUE: Multidetector CT imaging of the abdomen and pelvis was performed using the standard protocol following bolus administration of intravenous contrast. CONTRAST:  ISOVUE-300 IOPAMIDOL (ISOVUE-300) INJECTION 61% COMPARISON:  Chest x-ray dated 01/03/2007 FINDINGS: Lower chest: Normal. Hepatobiliary: No focal liver abnormality is seen. No gallstones, gallbladder wall thickening, or biliary dilatation. Pancreas: Unremarkable. No pancreatic ductal dilatation or  surrounding inflammatory changes. Spleen: Normal in size without focal abnormality. Adrenals/Urinary Tract: Adrenal glands are unremarkable. Kidneys are normal, without renal calculi, focal lesion, or hydronephrosis. Bladder is unremarkable. Stomach/Bowel: Stomach is within normal limits. Appendix appears normal. No evidence of bowel wall thickening, distention, or inflammatory changes. Vascular/Lymphatic: No significant vascular findings are present. No enlarged abdominal or pelvic lymph nodes. Reproductive: Uterus and bilateral adnexa are unremarkable. Other: No abdominal wall hernia or abnormality. No abdominopelvic ascites. Musculoskeletal: There are numerous tiny sclerotic lesions in the bones of the pelvis and in the proximal femurs. There is suggestion of a few tiny sclerotic lesions in the. Bones otherwise appear essentially normal. IMPRESSION: 1. No acute abnormalities of the abdomen or pelvis. 2. Numerous tiny sclerotic lesions in the bones of the pelvis and proximal femurs, nonspecific. Review of the prior chest x-ray from 2008 suggests tiny sclerotic lesions in the bones of the shoulders bilaterally. 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 Electronically Signed: By: Francene Boyers M.D. On: 09/28/2018 12:49     Pamella Pert, MD, PhD [facility]  Contact via  [email or web address]  [facility] Office Info P: [phone]  F: [phone]

## 2018-09-30 NOTE — Progress Notes (Signed)
*  PRELIMINARY RESULTS* Echocardiogram 2D Echocardiogram has been performed.  Crystal Mccoy 09/30/2018, 9:13 AM

## 2018-10-01 ENCOUNTER — Observation Stay (HOSPITAL_COMMUNITY): Payer: Managed Care, Other (non HMO)

## 2018-10-01 DIAGNOSIS — M2550 Pain in unspecified joint: Secondary | ICD-10-CM | POA: Diagnosis not present

## 2018-10-01 DIAGNOSIS — R52 Pain, unspecified: Secondary | ICD-10-CM | POA: Diagnosis not present

## 2018-10-01 DIAGNOSIS — R6510 Systemic inflammatory response syndrome (SIRS) of non-infectious origin without acute organ dysfunction: Secondary | ICD-10-CM | POA: Diagnosis not present

## 2018-10-01 LAB — PROTEIN ELECTROPHORESIS, SERUM
A/G Ratio: 1 (ref 0.7–1.7)
Albumin ELP: 3.4 g/dL (ref 2.9–4.4)
Alpha-1-Globulin: 0.4 g/dL (ref 0.0–0.4)
Alpha-2-Globulin: 0.7 g/dL (ref 0.4–1.0)
Beta Globulin: 1.3 g/dL (ref 0.7–1.3)
GLOBULIN, TOTAL: 3.3 g/dL (ref 2.2–3.9)
Gamma Globulin: 0.9 g/dL (ref 0.4–1.8)
Total Protein ELP: 6.7 g/dL (ref 6.0–8.5)

## 2018-10-01 LAB — GLUCOSE, CAPILLARY
Glucose-Capillary: 207 mg/dL — ABNORMAL HIGH (ref 70–99)
Glucose-Capillary: 163 mg/dL — ABNORMAL HIGH (ref 70–99)

## 2018-10-01 LAB — PARVOVIRUS B19 ANTIBODY, IGG AND IGM
Parovirus B19 IgG Abs: 6.2 index — ABNORMAL HIGH (ref 0.0–0.8)
Parovirus B19 IgM Abs: 0.6 index (ref 0.0–0.8)

## 2018-10-01 LAB — EPSTEIN-BARR VIRUS VCA, IGG: EBV VCA IgG: <18 U/mL (ref 0.0–17.9)

## 2018-10-01 NOTE — Discharge Instructions (Signed)
Follow with PCP in 1-2 weeks  Please get a complete blood count and chemistry panel checked by your Primary MD at your next visit, and again as instructed by your Primary MD. Please get your medications reviewed and adjusted by your Primary MD.  Please request your Primary MD to go over all Hospital Tests and Procedure/Radiological results at the follow up, please get all Hospital records sent to your Prim MD by signing hospital release before you go home.  If you had Pneumonia of Lung problems at the Hospital: Please get a 2 view Chest X ray done in 6-8 weeks after hospital discharge or sooner if instructed by your Primary MD.  If you have Congestive Heart Failure: Please call your Cardiologist or Primary MD anytime you have any of the following symptoms:  1) 3 pound weight gain in 24 hours or 5 pounds in 1 week  2) shortness of breath, with or without a dry hacking cough  3) swelling in the hands, feet or stomach  4) if you have to sleep on extra pillows at night in order to breathe  Follow cardiac low salt diet and 1.5 lit/day fluid restriction.  If you have diabetes Accuchecks 4 times/day, Once in AM empty stomach and then before each meal. Log in all results and show them to your primary doctor at your next visit. If any glucose reading is under 80 or above 300 call your primary MD immediately.  If you have Seizure/Convulsions/Epilepsy: Please do not drive, operate heavy machinery, participate in activities at heights or participate in high speed sports until you have seen by Primary MD or a Neurologist and advised to do so again.  If you had Gastrointestinal Bleeding: Please ask your Primary MD to check a complete blood count within one week of discharge or at your next visit. Your endoscopic/colonoscopic biopsies that are pending at the time of discharge, will also need to followed by your Primary MD.  Get Medicines reviewed and adjusted. Please take all your medications with you  for your next visit with your Primary MD  Please request your Primary MD to go over all hospital tests and procedure/radiological results at the follow up, please ask your Primary MD to get all Hospital records sent to his/her office.  If you experience worsening of your admission symptoms, develop shortness of breath, life threatening emergency, suicidal or homicidal thoughts you must seek medical attention immediately by calling 911 or calling your MD immediately  if symptoms less severe.  You must read complete instructions/literature along with all the possible adverse reactions/side effects for all the Medicines you take and that have been prescribed to you. Take any new Medicines after you have completely understood and accpet all the possible adverse reactions/side effects.   Do not drive or operate heavy machinery when taking Pain medications.   Do not take more than prescribed Pain, Sleep and Anxiety Medications  Special Instructions: If you have smoked or chewed Tobacco  in the last 2 yrs please stop smoking, stop any regular Alcohol  and or any Recreational drug use.  Wear Seat belts while driving.  Please note You were cared for by a hospitalist during your hospital stay. If you have any questions about your discharge medications or the care you received while you were in the hospital after you are discharged, you can call the unit and asked to speak with the hospitalist on call if the hospitalist that took care of you is not available. Once you are  discharged, your primary care physician will handle any further medical issues. Please note that NO REFILLS for any discharge medications will be authorized once you are discharged, as it is imperative that you return to your primary care physician (or establish a relationship with a primary care physician if you do not have one) for your aftercare needs so that they can reassess your need for medications and monitor your lab values. ° °You  can reach the hospitalist office at phone [phone] or fax [phone] °  °If you do not have a primary care physician, you can call [phone] for a physician referral. ° °Activity: As tolerated with Full fall precautions use walker/cane & assistance as needed ° °Diet: regular ° °Disposition Home ° ° °

## 2018-10-01 NOTE — Discharge Summary (Signed)
Physician Discharge Summary  Crystal Mccoy GBT:[id number] DOB: [birth year] DOA: 09/27/2018  PCP: System, Pcp Not In  Admit date: 09/27/2018 Discharge date: 10/01/2018  Admitted From: home Disposition:  home  Recommendations for Outpatient Follow-up:  1. Follow up with Dr. Ellin Saba in 3 days as scheduled 2. Follow up with ID in 1-2 weeks  Home Health: none Equipment/Devices: none  Discharge Condition: stable CODE STATUS: Full code Diet recommendation: diabetic  HPI: Per admitting MD, Crystal Mccoy  is a 38 y.o. female,With history of diabetes mellitus type II, came to hospital with 4-week history of joint pains with intermittent fever.  Patient says that she started having joint pains involving mainly the right hip joint and knee along with right big toe.  She noticed that she was having spasms involved with the right hip which led to stiffness of the hip joint initially and she was unable to move for 20 minutes.  She also has been having intermittent stiffness involving the right knee as well as right big toe.  She also has been having low-grade temperature intermittently over the past 4 weeks.  Patient says that temperature was 101.7 today at home so she came back to ED.  However in the ED patient is afebrile. Lab work initially showed lactic acid 3.17 but cleared in 2 hours after patient received IV fluids and empiric antibiotics.  Patient did not have hypotension or tachypnea.  She did have  mild tachycardia with heart rate 106. Patient says that she was seen by her PCP and she had a complete work-up including labs for rheumatoid arthritis, Lyme disease, connective tissue disorder, autoimmune work-up.  Labs are pending at this time. She denies neck pain, photophobia Denies toothache or gum problems Denies sinus drainage, no runny nose or sore throat. Denies chest pain, no shortness of breath.  No coughing up phlegm. Denies nausea, vomiting or diarrhea. Denies abdominal pain. Denies vaginal  discharge. No previous history of pelvic inflammatory disease Patient says that she has not eaten food for past 3 to 4 days. Denies calf tenderness  Hospital Course: Fever of unknown origin/SIRS -patient was admitted to the hospital with fever of unknown origin. This is been going on for about a month, other than right hip, right knee and right ankle pain she had no other systemic symptoms.  Specifically, no cough/chest congestion/shortness of breath, no abdominal pain, nausea vomiting or diarrhea, no dysuria, no URI type symptoms. She was evaluated as an outpatient 3 days prior to this hospitalization, I have discussed with nurse practitioner in [place] who evaluated patient, and at that time her CBC, CMP, Lyme serology, ANA, thyroid function tests and vitamin D levels were normal.  Rheumatoid factor was borderline elevated. I have discussed case with infectious disease Dr. Orvan Falconer over the phone, and will start FUO work-up. She underwent CT scan of the chest, abdomen and pelvis as well as right hip which was pertinent for 3 enlarged lymph nodes, 10 mm anterior mediastinal lymph node, around the GE junction and between the portal vein and the IVC there was a 52 x 35 x 19 mm lymph node as well as a 12 mm lymph node near the hepatic hilum.  Given hip pain and back pain she also underwent an MRI of the L-spine which was negative for acute findings.  Patient has been having intermittent headaches and underwent an MRI of the brain which was completely negative. Blood work is essentially normal except for very mildly elevated LFTs, stable.  She has no leukocytosis. She underwent hepatitis panel which was negative.  HIV was negative as well.  EBV IgM was negative.  EBV work-up was negative.  ENA was negative.  Anti-double-stranded DNA, complements were unremarkable.  Myeloperoxidase antibody was negative, anti-proteinase 3 was slightly elevated, question more nonspecific finding.  Ferritin was slightly  elevated representing likely an acute phase reactant.  Sed rate and CRP were elevated.  SPEP is pending at the time of discharge.   Patient has been clinically stable in the hospital, she has had intermittent fevers but no hypotension or sepsis-like physiology.  Fever was well controlled with antipyretics.  She was observed off antibiotics for hospital stay, she had 2 sets of blood cultures which have remained negative.  In addition, 2D echocardiogram was negative for vegetations.  Case was discussed with Dr. Ellin Saba from hematology, given lymphadenopathy and recurrent fevers he will evaluate patient in clinic, she probably needs a PET CT scan to determine whether lymphadenopathy will need to be biopsied.  She will also follow-up with infectious disease for fever of unknown origin if hematology work-up turns up negative.  She was discharged home in stable condition, with close outpatient follow-up.  Diabetes mellitus -Hemoglobin A1c 6.1, continue home regimen, diet  Discharge Diagnoses:  Active Problems:   Hip pain   Discharge Instructions  Allergies as of 10/01/2018      Reactions   Omniflox [temafloxacin]    Latex Rash   Only when on face   Penicillins Rash   Has patient had a PCN reaction causing immediate rash, facial/tongue/throat swelling, SOB or lightheadedness with hypotension: Yes Has patient had a PCN reaction causing severe rash involving mucus membranes or skin necrosis: No Has patient had a PCN reaction that required hospitalization No Has patient had a PCN reaction occurring within the last 10 years: No If all of the above answers are "NO", then may proceed with Cephalosporin use.      Medication List    TAKE these medications   desvenlafaxine 50 MG 24 hr tablet Commonly known as:  PRISTIQ Take 50 mg by mouth daily.   hydrOXYzine 25 MG tablet Commonly known as:  ATARAX/VISTARIL Take 1 tablet (25 mg total) by mouth every 8 (eight) hours as needed for anxiety.     liraglutide 18 MG/3ML Sopn Commonly known as:  VICTOZA Inject 1.8 mg into the skin.   ORTHO TRI-CYCLEN LO 0.18/0.215/0.25 MG-25 MCG tab Generic drug:  Norgestimate-Ethinyl Estradiol Triphasic Take 1 tablet by mouth daily.   OVASITOL PO Take by mouth.   phentermine 37.5 MG capsule Take 37.5 mg by mouth every morning.   Vitamin D (Ergocalciferol) 1.25 MG (50000 UT) Caps capsule Commonly known as:  DRISDOL Take 50,000 Units by mouth every 7 (seven) days.      Follow-up Information    Doreatha Massed, MD. Call in 1 day(s).   Specialty:  Hematology Why:  for an appointment as soon as possible Contact information: [address] [place] [address] [phone]        [facility]. Schedule an appointment as soon as possible for a visit in 1 week(s).   Specialty:  Infectious Diseases Why:  for fever of unknown origin.  Contact information: [address] [id number] mc [place] [address] [phone]          Consultations:  ID - phone  Hematology - phone  Orthopedic surgery   Procedures/Studies:  2D echo  Study Conclusions - Left ventricle: The cavity  size was normal. Systolic function was normal. The estimated ejection fraction was in the range of 60% to 65%. Wall motion was normal; there were no regional wall motion abnormalities. The study is not technically sufficient to allow evaluation of LV diastolic function. - Aortic valve: Transvalvular velocity was within the normal range. There was no stenosis. There was no regurgitation. - Aortic root: The aortic root was normal in size. - Mitral valve: There was no regurgitation. - Left atrium: The atrium was normal in size. - Right ventricle: The cavity size was normal. Wall thickness was normal. Systolic function was normal. - Right atrium: The atrium was normal in size. - Tricuspid valve: There was no regurgitation. - Inferior vena cava: The vessel was normal in size.  The respirophasic diameter changes were in the normal range (= 50%), consistent with normal central venous pressure. - Pericardium, extracardiac: A trivial pericardial effusion was identified.  Dg Chest 2 View  Result Date: 09/27/2018 CLINICAL DATA:  Generalized body aches starting 4 weeks ago. Night sweats. Low grade fever for 12 days. Increased fever and diaphoresis today. History of diabetes. EXAM: CHEST - 2 VIEW COMPARISON:  01/03/2007 FINDINGS: The heart size and mediastinal contours are within normal limits. Both lungs are clear. The visualized skeletal structures are unremarkable. IMPRESSION: No active cardiopulmonary disease. Electronically Signed   By: Burman Nieves M.D.   On: 09/27/2018 21:24   Ct Chest W Contrast  Result Date: 09/29/2018 CLINICAL DATA:  Fever of unknown origin x 1 month, with joint pain. Hx of diabetes . Adenopathy seen on abdomen/pelvis CT. EXAM: CT CHEST WITH CONTRAST TECHNIQUE: Multidetector CT imaging of the chest was performed during intravenous contrast administration. CONTRAST:  15mL OMNIPAQUE IOHEXOL 300 MG/ML  SOLN COMPARISON:  CT abdomen from the previous day FINDINGS: Cardiovascular: Heart size normal. Trace pericardial fluid. Thoracic aorta is nonaneurysmal. Mediastinum/Nodes: Single prominent 10 mm anterior mediastinal lymph node. No other mediastinal, hilar or axillary adenopathy. Lungs/Pleura: No pleural effusion. No pneumothorax. Lungs are clear. Upper Abdomen: Stable gastrohepatic and porta hepatis nodes. No acute findings. Musculoskeletal: No chest wall abnormality. No acute or significant osseous findings. IMPRESSION: 1. Single prominent 10 mm anterior mediastinal lymph node, nonspecific. 2. Otherwise negative exam. Electronically Signed   By: Corlis Leak M.D.   On: 09/29/2018 11:32   Mr Brain Wo Contrast  Result Date: 10/01/2018 CLINICAL DATA:  Generalized body aching beginning about 4 weeks ago. Recent development of night sweats. EXAM: MRI HEAD WITHOUT  CONTRAST TECHNIQUE: Multiplanar, multiecho pulse sequences of the brain and surrounding structures were obtained without intravenous contrast. COMPARISON:  None. FINDINGS: Brain: The brain has a normal appearance without evidence of malformation, atrophy, old or acute small or large vessel infarction, mass lesion, hemorrhage, hydrocephalus or extra-axial collection. Vascular: Major vessels at the base of the brain show flow. Venous sinuses appear patent. Skull and upper cervical spine: Normal. Sinuses/Orbits: Clear/normal. Other: None significant. IMPRESSION: Normal examination. No abnormality seen to explain the presenting symptoms. Electronically Signed   By: Paulina Fusi M.D.   On: 10/01/2018 11:14   Mr Lumbar Spine Wo Contrast  Result Date: 09/28/2018 CLINICAL DATA:  Initial evaluation for lower back pain radiating into the right lower extremity for 2 weeks. EXAM: MRI LUMBAR SPINE WITHOUT CONTRAST TECHNIQUE: Multiplanar, multisequence MR imaging of the lumbar spine was performed. No intravenous contrast was administered. COMPARISON:  None. FINDINGS: Segmentation: Standard. Lowest well-formed disc labeled the L5-S1 level. Alignment: Vertebral bodies normally aligned with preservation of the normal lumbar lordosis.  No listhesis. Vertebrae: Vertebral body heights well maintained without evidence for acute or chronic fracture. Bone marrow signal intensity diffusely decreased on T1 weighted imaging, most commonly related to anemia, smoking, or obesity. No discrete or worrisome osseous lesions. No abnormal marrow edema. Conus medullaris and cauda equina: Conus extends to the L1-2 level. Conus and cauda equina appear normal. Paraspinal and other soft tissues: Paraspinous soft tissues within normal limits. Visualized visceral structures unremarkable. Disc levels: No significant disc pathology seen within the lumbar spine. Intervertebral discs are well hydrated with preserved disc height. No significant disc bulge or  disc protrusion. No significant facet degeneration. Epidural lipomatosis noted at the L5-S1 level with mild compression of the distal thecal sac. No other significant stenosis. No evidence for neural impingement. IMPRESSION: 1. 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 2. Otherwise unremarkable and normal MRI of the lumbar spine. No significant disc pathology or stenosis. No neural impingement. Electronically Signed   By: Rise MuBenjamin  McClintock M.D.   On: 09/28/2018 15:56   Ct Abdomen Pelvis W Contrast  Addendum Date: 09/28/2018   ADDENDUM REPORT: 09/28/2018 17:53 ADDENDUM: Correction: Patient does have adenopathy in the upper abdomen around the gastroesophageal junction and between the portal vein and inferior vena cava where there is a 52 x 35 x 19 mm lymph node. There is a 12 mm lymph node near the hepatic hilum. These are nonspecific. The possibility of lymphoma should be considered. Electronically Signed   By: Francene BoyersJames  Maxwell M.D.   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 There are no arthritic changes of significance in the right hip. No joint effusion. Adjacent soft tissues appear normal. Electronically Signed   By: Francene BoyersJames  Maxwell M.D.   On: 09/28/2018 12:54   Result Date: 09/28/2018 CLINICAL DATA:  Abdominal pain and fever. EXAM: CT ABDOMEN AND PELVIS WITH CONTRAST TECHNIQUE: Multidetector CT imaging of the abdomen and pelvis was performed using the standard protocol following bolus administration of intravenous contrast. CONTRAST:  100mL ISOVUE-300 IOPAMIDOL (ISOVUE-300) INJECTION 61% COMPARISON:  Chest x-ray dated 01/03/2007 FINDINGS: Lower chest: Normal. Hepatobiliary: No focal liver abnormality is seen. No gallstones,  gallbladder wall thickening, or biliary dilatation. Pancreas: Unremarkable. No pancreatic ductal dilatation or surrounding inflammatory changes. Spleen: Normal in size without focal abnormality. Adrenals/Urinary Tract: Adrenal glands are unremarkable. Kidneys are normal, without renal calculi, focal lesion, or hydronephrosis. Bladder is unremarkable. Stomach/Bowel: Stomach is within normal limits. Appendix appears normal. No evidence of bowel wall thickening, distention, or inflammatory changes. Vascular/Lymphatic: No significant vascular findings are present. No enlarged abdominal or pelvic lymph nodes. Reproductive: Uterus and bilateral adnexa are unremarkable. Other: No abdominal wall hernia or abnormality. No abdominopelvic ascites. Musculoskeletal: There are numerous tiny sclerotic lesions in the bones of the pelvis and in the proximal femurs. There is suggestion of a few tiny sclerotic lesions in the. Bones otherwise appear essentially normal. IMPRESSION: 1. No acute abnormalities of the abdomen or pelvis. 2. Numerous tiny sclerotic lesions in the bones of the pelvis and proximal femurs, nonspecific. Review of the prior chest x-ray from 2008 suggests tiny sclerotic lesions in the bones of the shoulders bilaterally. 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 Electronically Signed: By: Francene BoyersJames  Maxwell M.D. On: 09/28/2018 12:49   Ct No Charge  Addendum Date: 09/28/2018  ADDENDUM REPORT: 09/28/2018 17:53 ADDENDUM: Correction: Patient does have adenopathy in the upper abdomen around the gastroesophageal junction and between the portal vein and inferior vena cava where there is a 52 x 35 x 19 mm lymph node. There is a 12 mm lymph node near the hepatic hilum. These are nonspecific. The possibility of lymphoma should be considered. Electronically Signed   By: Francene Boyers M.D.   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 There are no arthritic changes of significance in the right hip. No joint effusion. Adjacent soft tissues appear normal. Electronically Signed   By: Francene Boyers M.D.   On: 09/28/2018 12:54   Result Date: 09/28/2018 CLINICAL DATA:  Abdominal pain and fever. EXAM: CT ABDOMEN AND PELVIS WITH CONTRAST TECHNIQUE: Multidetector CT imaging of the abdomen and pelvis was performed using the standard protocol following bolus administration of intravenous contrast. CONTRAST:  ISOVUE-300 IOPAMIDOL (ISOVUE-300) INJECTION 61% COMPARISON:  Chest x-ray dated 01/03/2007 FINDINGS: Lower chest: Normal. Hepatobiliary: No focal liver abnormality is seen. No gallstones, gallbladder wall thickening, or biliary dilatation. Pancreas: Unremarkable. No pancreatic ductal dilatation or surrounding inflammatory changes. Spleen: Normal in size without focal abnormality. Adrenals/Urinary Tract: Adrenal glands are unremarkable. Kidneys are normal, without renal calculi, focal lesion, or hydronephrosis. Bladder is unremarkable. Stomach/Bowel: Stomach is within normal limits. Appendix appears normal. No evidence of bowel wall thickening, distention, or inflammatory changes. Vascular/Lymphatic: No significant vascular findings are present. No enlarged abdominal or pelvic lymph nodes. Reproductive: Uterus and bilateral adnexa are unremarkable. Other: No abdominal wall hernia or abnormality. No abdominopelvic ascites. Musculoskeletal: There are numerous tiny sclerotic lesions in the bones of the pelvis and in the proximal femurs. There is suggestion of a few tiny sclerotic lesions in the. Bones otherwise appear essentially normal. IMPRESSION: 1. No acute abnormalities of the abdomen or pelvis. 2. Numerous tiny sclerotic lesions in the bones of the pelvis and proximal femurs, nonspecific. Review of the  prior chest x-ray from 2008 suggests tiny sclerotic lesions in the bones of the shoulders bilaterally. 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 Electronically Signed: By: Francene Boyers M.D. On: 09/28/2018 12:49   Dg Hip Unilat With Pelvis 2-3 Views Right  Result Date: 09/28/2018 CLINICAL DATA:  Hip pain EXAM: DG HIP (WITH OR WITHOUT PELVIS) 2-3V RIGHT COMPARISON:  None. FINDINGS: There is no evidence of hip fracture or dislocation. There is no evidence of arthropathy or other focal bone abnormality. IMPRESSION: Negative. Electronically Signed   By: Jasmine Pang M.D.   On: 09/28/2018 01:11      Subjective: - no chest pain, shortness of breath, no abdominal pain, nausea or vomiting.   Discharge Exam: Vitals:   09/30/18 2114 10/01/18 0533  BP: 129/82 (!) 142/71  Pulse: (!) 106 98  Resp: 20 18  Temp: 99.9 F (37.7 C) 99.1 F (37.3 C)  SpO2: 99% 99%    General: Pt is alert, awake, not in acute distress Cardiovascular: RRR, S1/S2 +, no rubs, no gallops Respiratory: CTA bilaterally, no wheezing, no rhonchi Abdominal: Soft, NT, ND, bowel sounds + Extremities: no edema, no cyanosis    The results of significant diagnostics from this hospitalization (including imaging, microbiology, ancillary and laboratory) are listed below for reference.     Microbiology: Recent Results (from the past 240 hour(s))  Blood Culture (  routine x 2)     Status: None (Preliminary result)   Collection Time: 09/27/18  8:57 PM  Result Value Ref Range Status   Specimen Description BLOOD RIGHT ANTECUBITAL  Final   Special Requests   Final    BOTTLES DRAWN AEROBIC AND ANAEROBIC Blood Culture adequate volume   Culture   Final    NO GROWTH 4 DAYS Performed at [facility], [address] [place], [place] [address]    Report Status PENDING  Incomplete  Blood Culture (routine x 2)     Status: None (Preliminary result)   Collection Time: 09/27/18  9:23 PM  Result Value Ref Range Status   Specimen  Description BLOOD LEFT ANTECUBITAL  Final   Special Requests   Final    BOTTLES DRAWN AEROBIC AND ANAEROBIC Blood Culture adequate volume   Culture   Final    NO GROWTH 4 DAYS Performed at [facility], [address] [place], [place] [address]    Report Status PENDING  Incomplete  Culture, blood (Routine X 2) w Reflex to ID Panel     Status: None (Preliminary result)   Collection Time: 09/28/18  7:19 PM  Result Value Ref Range Status   Specimen Description BLOOD RIGHT HAND  Final   Special Requests   Final    BOTTLES DRAWN AEROBIC AND ANAEROBIC Blood Culture adequate volume   Culture   Final    NO GROWTH 3 DAYS Performed at [facility], [address] [place], [place] [address]    Report Status PENDING  Incomplete  Culture, blood (Routine X 2) w Reflex to ID Panel     Status: None (Preliminary result)   Collection Time: 09/28/18  7:28 PM  Result Value Ref Range Status   Specimen Description BLOOD LEFT ARM  Final   Special Requests   Final    BOTTLES DRAWN AEROBIC AND ANAEROBIC Blood Culture adequate volume   Culture   Final    NO GROWTH 3 DAYS Performed at [facility], [address] [place], [place] [address]    Report Status PENDING  Incomplete     Labs: BNP (last 3 results) No results for input(s): BNP in the last 8760 hours. Basic Metabolic Panel: Recent Labs  Lab 09/27/18 2057 09/28/18 0602 09/29/18 0640 09/30/18 0758  NA 131* 134* 134* 131*  K 4.0 3.8 4.0 3.7  CL 97* 99 101 98  CO2 22 26 25 22   GLUCOSE 136* 138* 157* 151*  BUN 10 9 6 9   CREATININE 0.69 0.57 0.57 0.55  CALCIUM 9.1 8.7* 8.7* 8.6*   Liver Function Tests: Recent Labs  Lab 09/27/18 2057 09/28/18 0602 09/29/18 0640 09/30/18 0758  AST 41 81* 82* 88*  ALT 30 46* 70* 82*  ALKPHOS 88 115 109 143*  BILITOT 0.6 0.8 0.7 1.2  PROT 7.8 7.5 7.1 7.7  ALBUMIN 3.9 3.7 3.4* 3.6   No results for input(s): LIPASE, AMYLASE in the last 168 hours. No results for input(s): AMMONIA in the last 168  hours. CBC: Recent Labs  Lab 09/27/18 2057 09/28/18 0602 09/29/18 0640 09/30/18 0758  WBC 4.7 3.6* 3.6* 4.9  NEUTROABS 2.1  --  1.4* 1.5*  HGB 12.9 12.6 12.0 12.1  HCT 39.1 38.6 36.1 36.8  MCV 87.9 90.4 90.3 89.5  PLT 176 166 150 177   Cardiac Enzymes: Recent Labs  Lab 09/27/18 2047 09/28/18 1701  CKTOTAL 12* 21*   BNP: Invalid input(s): POCBNP CBG: Recent Labs  Lab 09/30/18 1111 09/30/18  1625 09/30/18 2111 10/01/18 0735 10/01/18 1117  GLUCAP 205* 147* 153* 207* 163*   D-Dimer No results for input(s): DDIMER in the last 72 hours. Hgb A1c No results for input(s): HGBA1C in the last 72 hours. Lipid Profile No results for input(s): CHOL, HDL, LDLCALC, TRIG, CHOLHDL, LDLDIRECT in the last 72 hours. Thyroid function studies No results for input(s): TSH, T4TOTAL, T3FREE, THYROIDAB in the last 72 hours.  Invalid input(s): FREET3 Anemia work up Recent Labs    09/28/18 1613  FERRITIN 315*   Urinalysis    Component Value Date/Time   COLORURINE AMBER (A) 09/27/2018 2151   APPEARANCEUR HAZY (A) 09/27/2018 2151   LABSPEC 1.028 09/27/2018 2151   PHURINE 5.0 09/27/2018 2151   GLUCOSEU NEGATIVE 09/27/2018 2151   HGBUR NEGATIVE 09/27/2018 2151   BILIRUBINUR NEGATIVE 09/27/2018 2151   KETONESUR 5 (A) 09/27/2018 2151   PROTEINUR 30 (A) 09/27/2018 2151   UROBILINOGEN 0.2 10/22/2012 0535   NITRITE NEGATIVE 09/27/2018 2151   LEUKOCYTESUR NEGATIVE 09/27/2018 2151   Sepsis Labs Invalid input(s): PROCALCITONIN,  WBC,  LACTICIDVEN   Time coordinating discharge: 40 minutes  SIGNED:  Pamella Pert, MD  [facility] 10/01/2018, 12:47 PM

## 2018-10-02 LAB — CULTURE, BLOOD (ROUTINE X 2)
Culture: NO GROWTH
Culture: NO GROWTH
Special Requests: ADEQUATE
Special Requests: ADEQUATE

## 2018-10-02 LAB — QUANTIFERON-TB GOLD PLUS (RQFGPL)
QuantiFERON Mitogen Value: >10 IU/mL
QuantiFERON Nil Value: 1.33 IU/mL
QuantiFERON TB1 Ag Value: 1.25 IU/mL
QuantiFERON TB2 Ag Value: 1.26 IU/mL

## 2018-10-02 LAB — QUANTIFERON-TB GOLD PLUS: QuantiFERON-TB Gold Plus: NEGATIVE

## 2018-10-02 LAB — RSV(RESPIRATORY SYNCYTIAL VIRUS) AB, BLOOD: RSV Ab: NEGATIVE

## 2018-10-03 LAB — CULTURE, BLOOD (ROUTINE X 2)
Culture: NO GROWTH
Culture: NO GROWTH
Special Requests: ADEQUATE
Special Requests: ADEQUATE

## 2018-10-05 ENCOUNTER — Other Ambulatory Visit: Payer: Self-pay

## 2018-10-05 ENCOUNTER — Inpatient Hospital Stay (HOSPITAL_COMMUNITY): Payer: Managed Care, Other (non HMO) | Attending: Hematology | Admitting: Hematology

## 2018-10-05 ENCOUNTER — Encounter (HOSPITAL_COMMUNITY): Payer: Self-pay | Admitting: Hematology

## 2018-10-05 ENCOUNTER — Inpatient Hospital Stay (HOSPITAL_COMMUNITY): Payer: Managed Care, Other (non HMO)

## 2018-10-05 VITALS — BP 131/77 | HR 103 | Temp 99.2°F | Resp 22 | Ht 65.0 in | Wt 211.4 lb

## 2018-10-05 DIAGNOSIS — Z8249 Family history of ischemic heart disease and other diseases of the circulatory system: Secondary | ICD-10-CM | POA: Insufficient documentation

## 2018-10-05 DIAGNOSIS — M79604 Pain in right leg: Secondary | ICD-10-CM | POA: Insufficient documentation

## 2018-10-05 DIAGNOSIS — E119 Type 2 diabetes mellitus without complications: Secondary | ICD-10-CM | POA: Insufficient documentation

## 2018-10-05 DIAGNOSIS — R599 Enlarged lymph nodes, unspecified: Secondary | ICD-10-CM | POA: Insufficient documentation

## 2018-10-05 DIAGNOSIS — Z79899 Other long term (current) drug therapy: Secondary | ICD-10-CM | POA: Insufficient documentation

## 2018-10-05 DIAGNOSIS — R509 Fever, unspecified: Secondary | ICD-10-CM | POA: Insufficient documentation

## 2018-10-05 DIAGNOSIS — R591 Generalized enlarged lymph nodes: Secondary | ICD-10-CM | POA: Diagnosis not present

## 2018-10-05 DIAGNOSIS — R0602 Shortness of breath: Secondary | ICD-10-CM | POA: Insufficient documentation

## 2018-10-05 DIAGNOSIS — R61 Generalized hyperhidrosis: Secondary | ICD-10-CM | POA: Diagnosis not present

## 2018-10-05 DIAGNOSIS — M545 Low back pain: Secondary | ICD-10-CM | POA: Insufficient documentation

## 2018-10-05 DIAGNOSIS — R740 Nonspecific elevation of levels of transaminase and lactic acid dehydrogenase [LDH]: Secondary | ICD-10-CM | POA: Diagnosis not present

## 2018-10-05 LAB — COMPREHENSIVE METABOLIC PANEL
ALT: 81 U/L — AB (ref 0–44)
AST: 83 U/L — ABNORMAL HIGH (ref 15–41)
Albumin: 3.2 g/dL — ABNORMAL LOW (ref 3.5–5.0)
Alkaline Phosphatase: 347 U/L — ABNORMAL HIGH (ref 38–126)
Anion gap: 7 (ref 5–15)
BUN: 9 mg/dL (ref 6–20)
CALCIUM: 8.9 mg/dL (ref 8.9–10.3)
CO2: 26 mmol/L (ref 22–32)
CREATININE: 0.49 mg/dL (ref 0.44–1.00)
Chloride: 100 mmol/L (ref 98–111)
GFR calc Af Amer: >60 mL/min (ref >60)
Glucose, Bld: 118 mg/dL — ABNORMAL HIGH (ref 70–99)
Potassium: 4.3 mmol/L (ref 3.5–5.1)
Sodium: 133 mmol/L — ABNORMAL LOW (ref 135–145)
Total Bilirubin: 1.3 mg/dL — ABNORMAL HIGH (ref 0.3–1.2)
Total Protein: 8.4 g/dL — ABNORMAL HIGH (ref 6.5–8.1)

## 2018-10-05 LAB — LACTATE DEHYDROGENASE: LDH: 412 U/L — ABNORMAL HIGH (ref 98–192)

## 2018-10-05 NOTE — Progress Notes (Signed)
[facility] CONSULT NOTE  Patient Care Team: System, Pcp Not In as PCP - General  CHIEF COMPLAINTS/PURPOSE OF CONSULTATION: Lymphadenopathy with severe night sweats, fevers, chills.   HISTORY OF PRESENTING ILLNESS:  Crystal Mccoy 38 y.o. female is here because of a recent ER visit due to her increasing night sweats and fevers. She has experienced these for that last 5 weeks. Her fevers have been as high as 103. She has taken tylenol and motrin alternating over the past few weeks. Her fevers only comes down for 3 hours and goes back up. Her night sweats are so bad she has to get up and change her clothes and sheets multiple times a night. She has recently started having SOB with exertion. She has generalized aches and pain that she feels is more from running fevers. She experiences nausea occasionally but has not caused any weight loss at this time. When she eats a heavy meal she gets pressure around her stomach. She has noticed reflux for the past 3-4 days but denies ever having an issues with it in the past. She denies any dysphagia or trouble swallowing. Denies any vomiting or diarrhea. Denies any new pains. Had not noticed any recent bleeding such as epistaxis, hematuria or hematochezia. Denies recent chest pain on exertion, shortness of breath on minimal exertion, pre-syncopal episodes, or palpitations. Denies any numbness or tingling in hands or feet. Denies any recent infections or recent hospitalizations. She denies any tick bites.Patient reports appetite at 50% and energy level at 0%. She reports her father had liver cancer and her brother had prostate cancer. She lives at home with her husband and is normally full functioning. 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 She is able to perform all her own ADLs and activities. She is able to drive and handle her own finances.   MEDICAL HISTORY:  Past Medical  History:  Diagnosis Date  . Anxiety   . Depression   . Diabetes mellitus without complication (HCC)     SURGICAL HISTORY: Past Surgical History:  Procedure Laterality Date  . CESAREAN SECTION    . TONSILLECTOMY      SOCIAL HISTORY: Social History   Socioeconomic History  . Marital status: Married    Spouse name: Not on file  . Number of children: Not on file  . Years of education: Not on file  . Highest education level: Not on file  Occupational History  . Not on file  Social Needs  . Financial resource strain: Not on file  . Food insecurity:    Worry: Not on file    Inability: Not on file  . Transportation needs:    Medical: Not on file    Non-medical: Not on file  Tobacco Use  . Smoking status: Never Smoker  . Smokeless tobacco: Never Used  Substance and Sexual Activity  . Alcohol use: Yes  . Drug use: No  . Sexual activity: Yes  Lifestyle  . Physical activity:    Days per week: Not on file    Minutes per session: Not on file  . Stress: Not on file  Relationships  . Social connections:    Talks on phone: Not on file    Gets together: Not on file    Attends religious service: Not on file    Active member of club or organization: Not on file    Attends meetings  of clubs or organizations: Not on file    Relationship status: Not on file  . Intimate partner violence:    Fear of current or ex partner: Not on file    Emotionally abused: Not on file    Physically abused: Not on file    Forced sexual activity: Not on file  Other Topics Concern  . Not on file  Social History Narrative  . Not on file    FAMILY HISTORY: Family History  Problem Relation Age of Onset  . Hypertension Father   . Stroke Father   . Cancer - Other Father   . Seizures Father   . Diabetes Father   . Heart attack Father   . Cancer Brother   . Heart attack Paternal Uncle   . Heart attack Paternal Grandfather     ALLERGIES:  is allergic to omniflox [temafloxacin]; latex; and  penicillins.  MEDICATIONS:  Current Outpatient Medications  Medication Sig Dispense Refill  . desvenlafaxine (PRISTIQ) 50 MG 24 hr tablet Take 50 mg by mouth daily.    Marland Kitchen. liraglutide (VICTOZA) 18 MG/3ML SOPN Inject 1.8 mg into the skin.    . Norgestimate-Ethinyl Estradiol Triphasic (ORTHO TRI-CYCLEN LO) 0.18/0.215/0.25 MG-25 MCG tab Take 1 tablet by mouth daily.     No current facility-administered medications for this visit.     REVIEW OF SYSTEMS:   Constitutional: +fevers, +severe night sweats, +chills Eyes: Denies blurriness of vision, double vision or watery eyes Ears, nose, mouth, throat, and face: Denies mucositis or sore throat Respiratory: Denies cough, wheezes, +SOB with exertion Cardiovascular: Denies palpitation, chest discomfort or lower extremity swelling Gastrointestinal:  Denies nausea, + heartburn  Skin: Denies abnormal skin rashes Lymphatics: Denies new lymphadenopathy or easy bruising Neurological:Denies numbness, tingling or new weaknesses Behavioral/Psych: Mood is stable, no new changes  All other systems were reviewed with the patient and are negative.  PHYSICAL EXAMINATION: ECOG PERFORMANCE STATUS: 1 - Symptomatic but completely ambulatory  Vitals:   10/05/18 1300  BP: 131/77  Pulse: (!) 103  Resp: (!) 22  Temp: 99.2 F (37.3 C)  SpO2: 100%   Filed Weights   10/05/18 1300  Weight: 211 lb 6 oz (95.9 kg)    GENERAL:alert, no distress and comfortable SKIN: skin color, texture, turgor are normal, no rashes or significant lesions EYES: normal, conjunctiva are pink and non-injected, sclera clear OROPHARYNX:no exudate, no erythema and lips, buccal mucosa, and tongue normal  NECK: supple, thyroid normal size, non-tender, without nodularity LYMPH: No palpable lymphadenopathy in the neck, axilla or inguinal region. LUNGS: clear to auscultation and percussion with normal breathing effort HEART: regular rate & rhythm and no murmurs and no lower extremity  edema ABDOMEN:abdomen soft, non-tender and normal bowel sounds Musculoskeletal:no cyanosis of digits and no clubbing  PSYCH: alert & oriented x 3 with fluent speech NEURO: no focal motor/sensory deficits  LABORATORY DATA:  I have reviewed the data as listed Lab Results  Component Value Date   WBC 4.9 09/30/2018   HGB 12.1 09/30/2018   HCT 36.8 09/30/2018   MCV 89.5 09/30/2018   PLT 177 09/30/2018     Chemistry      Component Value Date/Time   NA 133 (L) 10/05/2018 1525   K 4.3 10/05/2018 1525   CL 100 10/05/2018 1525   CO2 26 10/05/2018 1525   BUN 9 10/05/2018 1525   CREATININE 0.49 10/05/2018 1525      Component Value Date/Time   CALCIUM 8.9 10/05/2018 1525   ALKPHOS 347 (H)  10/05/2018 1525   AST 83 (H) 10/05/2018 1525   ALT 81 (H) 10/05/2018 1525   BILITOT 1.3 (H) 10/05/2018 1525       RADIOGRAPHIC STUDIES: I have personally reviewed the radiological images as listed and agreed with the findings in the report. Dg Chest 2 View  Result Date: 09/27/2018 CLINICAL DATA:  Generalized body aches starting 4 weeks ago. Night sweats. Low grade fever for 12 days. Increased fever and diaphoresis today. History of diabetes. EXAM: CHEST - 2 VIEW COMPARISON:  01/03/2007 FINDINGS: The heart size and mediastinal contours are within normal limits. Both lungs are clear. The visualized skeletal structures are unremarkable. IMPRESSION: No active cardiopulmonary disease. Electronically Signed   By: Burman Nieves M.D.   On: 09/27/2018 21:24   Ct Chest W Contrast  Result Date: 09/29/2018 CLINICAL DATA:  Fever of unknown origin x 1 month, with joint pain. Hx of diabetes . Adenopathy seen on abdomen/pelvis CT. EXAM: CT CHEST WITH CONTRAST TECHNIQUE: Multidetector CT imaging of the chest was performed during intravenous contrast administration. CONTRAST:  43mL OMNIPAQUE IOHEXOL 300 MG/ML  SOLN COMPARISON:  CT abdomen from the previous day FINDINGS: Cardiovascular: Heart size normal. Trace  pericardial fluid. Thoracic aorta is nonaneurysmal. Mediastinum/Nodes: Single prominent 10 mm anterior mediastinal lymph node. No other mediastinal, hilar or axillary adenopathy. Lungs/Pleura: No pleural effusion. No pneumothorax. Lungs are clear. Upper Abdomen: Stable gastrohepatic and porta hepatis nodes. No acute findings. Musculoskeletal: No chest wall abnormality. No acute or significant osseous findings. IMPRESSION: 1. Single prominent 10 mm anterior mediastinal lymph node, nonspecific. 2. Otherwise negative exam. Electronically Signed   By: Corlis Leak M.D.   On: 09/29/2018 11:32   Mr Brain Wo Contrast  Result Date: 10/01/2018 CLINICAL DATA:  Generalized body aching beginning about 4 weeks ago. Recent development of night sweats. EXAM: MRI HEAD WITHOUT CONTRAST TECHNIQUE: Multiplanar, multiecho pulse sequences of the brain and surrounding structures were obtained without intravenous contrast. COMPARISON:  None. FINDINGS: Brain: The brain has a normal appearance without evidence of malformation, atrophy, old or acute small or large vessel infarction, mass lesion, hemorrhage, hydrocephalus or extra-axial collection. Vascular: Major vessels at the base of the brain show flow. Venous sinuses appear patent. Skull and upper cervical spine: Normal. Sinuses/Orbits: Clear/normal. Other: None significant. IMPRESSION: Normal examination. No abnormality seen to explain the presenting symptoms. Electronically Signed   By: Paulina Fusi M.D.   On: 10/01/2018 11:14   Mr Lumbar Spine Wo Contrast  Result Date: 09/28/2018 CLINICAL DATA:  Initial evaluation for lower back pain radiating into the right lower extremity for 2 weeks. EXAM: MRI LUMBAR SPINE WITHOUT CONTRAST TECHNIQUE: Multiplanar, multisequence MR imaging of the lumbar spine was performed. No intravenous contrast was administered. COMPARISON:  None. FINDINGS: Segmentation: Standard. Lowest well-formed disc labeled the L5-S1 level. Alignment: Vertebral bodies  normally aligned with preservation of the normal lumbar lordosis. No listhesis. Vertebrae: Vertebral body heights well maintained without evidence for acute or chronic fracture. Bone marrow signal intensity diffusely decreased on T1 weighted imaging, most commonly related to anemia, smoking, or obesity. No discrete or worrisome osseous lesions. No abnormal marrow edema. Conus medullaris and cauda equina: Conus extends to the L1-2 level. Conus and cauda equina appear normal. Paraspinal and other soft tissues: Paraspinous soft tissues within normal limits. Visualized visceral structures unremarkable. Disc levels: No significant disc pathology seen within the lumbar spine. Intervertebral discs are well hydrated with preserved disc height. No significant disc bulge or disc protrusion. No significant facet degeneration. Epidural lipomatosis  noted at the L5-S1 level with mild compression of the distal thecal sac. No other significant stenosis. No evidence for neural impingement. IMPRESSION: 1. 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 2. Otherwise unremarkable and normal MRI of the lumbar spine. No significant disc pathology or stenosis. No neural impingement. Electronically Signed   By: Rise Mu M.D.   On: 09/28/2018 15:56   Ct Abdomen Pelvis W Contrast  Addendum Date: 09/28/2018   ADDENDUM REPORT: 09/28/2018 17:53 ADDENDUM: Correction: Patient does have adenopathy in the upper abdomen around the gastroesophageal junction and between the portal vein and inferior vena cava where there is a 52 x 35 x 19 mm lymph node. There is a 12 mm lymph node near the hepatic hilum. These are nonspecific. The possibility of lymphoma should be considered. Electronically Signed   By: Francene Boyers M.D.   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 There are no arthritic changes of significance in the right hip. No joint effusion. Adjacent soft tissues appear normal. Electronically Signed   By: Francene Boyers M.D.   On: 09/28/2018 12:54   Result Date: 09/28/2018 CLINICAL DATA:  Abdominal pain and fever. EXAM: CT ABDOMEN AND PELVIS WITH CONTRAST TECHNIQUE: Multidetector CT imaging of the abdomen and pelvis was performed using the standard protocol following bolus administration of intravenous contrast. CONTRAST:  ISOVUE-300 IOPAMIDOL (ISOVUE-300) INJECTION 61% COMPARISON:  Chest x-ray dated 01/03/2007 FINDINGS: Lower chest: Normal. Hepatobiliary: No focal liver abnormality is seen. No gallstones, gallbladder wall thickening, or biliary dilatation. Pancreas: Unremarkable. No pancreatic ductal dilatation or surrounding inflammatory changes. Spleen: Normal in size without focal abnormality. Adrenals/Urinary Tract: Adrenal glands are unremarkable. Kidneys are normal, without renal calculi, focal lesion, or hydronephrosis. Bladder is unremarkable. Stomach/Bowel: Stomach is within normal limits. Appendix appears normal. No evidence of bowel wall thickening, distention, or inflammatory changes. Vascular/Lymphatic: No significant vascular findings are present. No enlarged abdominal or pelvic lymph nodes. Reproductive: Uterus and bilateral adnexa are unremarkable. Other: No abdominal wall hernia or abnormality. No abdominopelvic ascites. Musculoskeletal: There are numerous tiny sclerotic lesions in the bones of the pelvis and in the proximal femurs. There is suggestion of a few tiny sclerotic lesions in the. Bones otherwise appear essentially normal. IMPRESSION: 1. No acute abnormalities of the abdomen or pelvis. 2. Numerous tiny sclerotic lesions in the bones of the pelvis and proximal femurs, nonspecific. Review of the prior chest x-ray from 2008 suggests  tiny sclerotic lesions in the bones of the shoulders bilaterally. 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 Electronically Signed: By: Francene Boyers M.D. On: 09/28/2018 12:49   Ct No Charge  Addendum Date: 09/28/2018   ADDENDUM REPORT: 09/28/2018 17:53 ADDENDUM: Correction: Patient does have adenopathy in the upper abdomen around the gastroesophageal junction and between the portal vein and inferior vena cava where there is a 52 x 35 x 19 mm lymph node. There is a 12 mm lymph node near the hepatic hilum. These are nonspecific. The possibility of lymphoma should be considered. Electronically Signed   By: Francene Boyers M.D.   On: 09/28/2018 17:53   Addendum Date: 09/28/2018   ADDENDUM REPORT: 09/28/2018 12:54 ADDENDUM: Reconstructed images through the right hip demonstrate numerous tiny sclerotic lesions in the bones of the pelvis and in the  proximal femur consistent with osteopoikilosis. There are no arthritic changes of significance in the right hip. No joint effusion. Adjacent soft tissues appear normal. Electronically Signed   By: Francene BoyersJames  Maxwell M.D.   On: 09/28/2018 12:54   Result Date: 09/28/2018 CLINICAL DATA:  Abdominal pain and fever. EXAM: CT ABDOMEN AND PELVIS WITH CONTRAST TECHNIQUE: Multidetector CT imaging of the abdomen and pelvis was performed using the standard protocol following bolus administration of intravenous contrast. CONTRAST:  100mL ISOVUE-300 IOPAMIDOL (ISOVUE-300) INJECTION 61% COMPARISON:  Chest x-ray dated 01/03/2007 FINDINGS: Lower chest: Normal. Hepatobiliary: No focal liver abnormality is seen. No gallstones, gallbladder wall thickening, or biliary dilatation. Pancreas: Unremarkable. No pancreatic ductal dilatation or surrounding inflammatory changes. Spleen: Normal in size without focal abnormality. Adrenals/Urinary Tract: Adrenal glands are unremarkable. Kidneys are normal, without renal calculi, focal lesion, or hydronephrosis. Bladder is unremarkable. Stomach/Bowel:  Stomach is within normal limits. Appendix appears normal. No evidence of bowel wall thickening, distention, or inflammatory changes. Vascular/Lymphatic: No significant vascular findings are present. No enlarged abdominal or pelvic lymph nodes. Reproductive: Uterus and bilateral adnexa are unremarkable. Other: No abdominal wall hernia or abnormality. No abdominopelvic ascites. Musculoskeletal: There are numerous tiny sclerotic lesions in the bones of the pelvis and in the proximal femurs. There is suggestion of a few tiny sclerotic lesions in the. Bones otherwise appear essentially normal. IMPRESSION: 1. No acute abnormalities of the abdomen or pelvis. 2. Numerous tiny sclerotic lesions in the bones of the pelvis and proximal femurs, nonspecific. Review of the prior chest x-ray from 2008 suggests tiny sclerotic lesions in the bones of the shoulders bilaterally. 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 Electronically Signed: By: Francene BoyersJames  Maxwell M.D. On: 09/28/2018 12:49   Dg Hip Unilat With Pelvis 2-3 Views Right  Result Date: 09/28/2018 CLINICAL DATA:  Hip pain EXAM: DG HIP (WITH OR WITHOUT PELVIS) 2-3V RIGHT COMPARISON:  None. FINDINGS: There is no evidence of hip fracture or dislocation. There is no evidence of arthropathy or other focal bone abnormality. IMPRESSION: Negative. Electronically Signed   By: Jasmine PangKim  Fujinaga M.D.   On: 09/28/2018 01:11    ASSESSMENT & PLAN:  Lymphadenopathy 1.  Generalized lymphadenopathy: - She had severe night sweats and fevers since the last 4 to 5 weeks.  Denies any weight loss. -She also had low back pain radiating to the right leg on recent admission but it has completely subsided. - She was admitted to the hospital from 09/27/2018 through 10/01/2018 and underwent extensive work-up for fevers.  Infectious causes were ruled out.  Denies any tick bites.  Has 2 pet dogs. - Connective tissue disorder work-up was also negative. - CT scan of the CAP done on 09/28/2018 showed  1 cm anterior mediastinal lymph node.  There is also adenopathy in the upper abdomen around the GE junction and between portal vein and IVC where there is a 5.2 cm lymph node present.  There is a 1.2 cm lymph node near the hepatic hilum.  Brain MRI was negative. -LDH was elevated.  She also had mild elevation of AST and ALT. -Reports feeling heaviness in the stomach after eating meats and peanut butter, which lasts about 30 minutes.  Denies any nausea, vomiting or regurgitation. - Having fevers up to 102 degrees.  Patient constantly taking Tylenol alternating with ibuprofen every 6 hours at home. - I have recommended PET CT scan to further evaluate this lymphadenopathy.  This will also allow us to pick the appropriate lymph node to biopsy. - I will repeat  LDH and liver function panel today. -I will see her back after the PET CT scan.  2.  Family history: -Father had liver cancer. -Brother had prostate cancer.  Orders Placed This Encounter  Procedures  . NM PET Image Initial (PI) Skull Base To Thigh    Standing Status:   Future    Standing Expiration Date:   10/05/2019    Order Specific Question:   ** REASON FOR EXAM (FREE TEXT)    Answer:   lymphadenopathy    Order Specific Question:   If indicated for the ordered procedure, I authorize the administration of a radiopharmaceutical per Radiology protocol    Answer:   Yes    Order Specific Question:   Is the patient pregnant?    Answer:   No    Order Specific Question:   Preferred imaging location?    Answer:   [facility]    Order Specific Question:   Radiology Contrast Protocol - do NOT remove file path    Answer:   \\[email or web address]  . CT Biopsy    Standing Status:   Future    Standing Expiration Date:   10/05/2019    Order Specific Question:   Lab orders requested (DO NOT place separate lab orders, these will be automatically ordered during procedure specimen collection):    Answer:   Surgical Pathology     Order Specific Question:   Reason for Exam (SYMPTOM  OR DIAGNOSIS REQUIRED)    Answer:   lymphadenopathy    Order Specific Question:   Is patient pregnant?    Answer:   No    Order Specific Question:   Preferred imaging location?    Answer:   [facility]    Order Specific Question:   Radiology Contrast Protocol - do NOT remove file path    Answer:   \\[email or web address]  . Lactate dehydrogenase    Standing Status:   Future    Number of Occurrences:   1    Standing Expiration Date:   10/05/2019  . Comprehensive metabolic panel    Standing Status:   Future    Number of Occurrences:   1    Standing Expiration Date:   10/05/2019    All questions were answered. The patient knows to call the clinic with any problems, questions or concerns.      Doreatha Massed, MD 10/05/2018 5:02 PM

## 2018-10-05 NOTE — Patient Instructions (Signed)
[facility] at [facility] Discharge Instructions     Thank you for choosing [facility] at [facility] to provide your oncology and hematology care.  To afford each patient quality time with our provider, please arrive at least 15 minutes before your scheduled appointment time.   If you have a lab appointment with the Cancer Center please come in thru the  Main Entrance and check in at the main information desk  You need to re-schedule your appointment should you arrive 10 or more minutes late.  We strive to give you quality time with our providers, and arriving late affects you and other patients whose appointments are after yours.  Also, if you no show three or more times for appointments you may be dismissed from the clinic at the providers discretion.     Again, thank you for choosing [facility].  Our hope is that these requests will decrease the amount of time that you wait before being seen by our physicians.       _____________________________________________________________  Should you have questions after your visit to [facility], please contact our office at [phone] between the hours of 8:00 a.m. and 4:30 p.m.  Voicemails left after 4:00 p.m. will not be returned until the following business day.  For prescription refill requests, have your pharmacy contact our office and allow 72 hours.    Cancer Center Support Programs:   > Cancer Support Group  2nd Tuesday of the month 1pm-2pm, Journey Room    

## 2018-10-05 NOTE — Assessment & Plan Note (Addendum)
1.  Generalized lymphadenopathy: - She had severe night sweats and fevers since the last 4 to 5 weeks.  Denies any weight loss. -She also had low back pain radiating to the right leg on recent admission but it has completely subsided. - She was admitted to the hospital from 09/27/2018 through 10/01/2018 and underwent extensive work-up for fevers.  Infectious causes were ruled out.  Denies any tick bites.  Has 2 pet dogs. - Connective tissue disorder work-up was also negative. - CT scan of the CAP done on 09/28/2018 showed 1 cm anterior mediastinal lymph node.  There is also adenopathy in the upper abdomen around the GE junction and between portal vein and IVC where there is a 5.2 cm lymph node present.  There is a 1.2 cm lymph node near the hepatic hilum.  Brain MRI was negative. -LDH was elevated.  She also had mild elevation of AST and ALT. -Reports feeling heaviness in the stomach after eating meats and peanut butter, which lasts about 30 minutes.  Denies any nausea, vomiting or regurgitation. - Having fevers up to 102 degrees.  Patient constantly taking Tylenol alternating with ibuprofen every 6 hours at home. - I have recommended PET CT scan to further evaluate this lymphadenopathy.  This will also allow [place] to pick the appropriate lymph node to biopsy. - I will repeat LDH and liver function panel today. -I will see her back after the PET CT scan.  2.  Family history: -Father had liver cancer. -Brother had prostate cancer.

## 2018-10-10 ENCOUNTER — Telehealth: Payer: Self-pay | Admitting: *Deleted

## 2018-10-11 ENCOUNTER — Ambulatory Visit: Payer: Managed Care, Other (non HMO)

## 2018-10-12 ENCOUNTER — Telehealth (HOSPITAL_COMMUNITY): Payer: Self-pay | Admitting: Emergency Medicine

## 2018-10-12 NOTE — Telephone Encounter (Signed)
Patients husband called stating that Crystal Mccoy has a pea sized bump under left side of tongue causing her minimal discomfort. She is still having low grade fevers which she has already been having for the past few weeks. States throat is mildly sore but she has been dry heaving recently causing irritation to her throat.  Dr.Katragadda made aware and he says it is best to wait for pet scan next week which will give [place] a better idea of what this could be. Informed patient that if her symptoms were to increase then to call the clinic back. Patients husband verbalized understanding of this.

## 2018-10-17 ENCOUNTER — Ambulatory Visit
Admission: RE | Admit: 2018-10-17 | Discharge: 2018-10-17 | Disposition: A | Discharge status: Home / Self Care | Payer: Managed Care, Other (non HMO) | Source: Ambulatory Visit | Attending: Nurse Practitioner | Admitting: Nurse Practitioner

## 2018-10-17 DIAGNOSIS — R161 Splenomegaly, not elsewhere classified: Secondary | ICD-10-CM | POA: Diagnosis not present

## 2018-10-17 DIAGNOSIS — R591 Generalized enlarged lymph nodes: Secondary | ICD-10-CM | POA: Diagnosis present

## 2018-10-17 LAB — GLUCOSE, CAPILLARY: Glucose-Capillary: 136 mg/dL — ABNORMAL HIGH (ref 70–99)

## 2018-10-17 MED ORDER — FLUDEOXYGLUCOSE F - 18 (FDG) INJECTION
10.5000 | Freq: Once | INTRAVENOUS | Status: AC | PRN
  Administered 2018-10-17: 11.01 via INTRAVENOUS

## 2018-10-18 ENCOUNTER — Ambulatory Visit: Payer: Managed Care, Other (non HMO)

## 2018-10-18 ENCOUNTER — Ambulatory Visit: Payer: Managed Care, Other (non HMO) | Admitting: General Surgery

## 2018-10-18 ENCOUNTER — Encounter: Payer: Self-pay | Admitting: General Surgery

## 2018-10-18 VITALS — BP 152/89 | HR 111 | Temp 96.9°F | Resp 16 | Wt 206.4 lb

## 2018-10-18 DIAGNOSIS — R591 Generalized enlarged lymph nodes: Secondary | ICD-10-CM | POA: Diagnosis not present

## 2018-10-18 NOTE — Progress Notes (Signed)
Crystal Mccoy; [id number]; [birth year]   HPI Patient is a 38 year old white female who was referred to my care by Dr. Ellin Saba for lymph node biopsy.  Patient has generalized lymphadenopathy and has been referred for a lymph node biopsy to rule out lymphoma.  Patient has been having some night sweats.  Patient has 0 out of 10 pain.  She underwent a PET scan yesterday and she does have cervical, mediastinal, upper abdominal, and axillary lymphadenopathy. Past Medical History:  Diagnosis Date  . Anxiety   . Depression   . Diabetes mellitus without complication [facility])     Past Surgical History:  Procedure Laterality Date  . CESAREAN SECTION    . TONSILLECTOMY      Family History  Problem Relation Age of Onset  . Hypertension Father   . Stroke Father   . Cancer - Other Father   . Seizures Father   . Diabetes Father   . Heart attack Father   . Cancer Brother   . Heart attack Paternal Uncle   . Heart attack Paternal Grandfather     Current Outpatient Medications on File Prior to Visit  Medication Sig Dispense Refill  . desvenlafaxine (PRISTIQ) 50 MG 24 hr tablet Take 50 mg by mouth daily.    Marland Kitchen liraglutide (VICTOZA) 18 MG/3ML SOPN Inject 1.8 mg into the skin.    . Norgestimate-Ethinyl Estradiol Triphasic (ORTHO TRI-CYCLEN LO) 0.18/0.215/0.25 MG-25 MCG tab Take 1 tablet by mouth daily.     No current facility-administered medications on file prior to visit.     Allergies  Allergen Reactions  . Omniflox [Temafloxacin]   . Latex Rash    Only when on face  . Penicillins Rash    Has patient had a PCN reaction causing immediate rash, facial/tongue/throat swelling, SOB or lightheadedness with hypotension: Yes Has patient had a PCN reaction causing severe rash involving mucus membranes or skin necrosis: No Has patient had a PCN reaction that required hospitalization No Has patient had a PCN reaction occurring within the last 10 years: No If all of the above answers are "NO", then may  proceed with Cephalosporin use.     Social History   Substance and Sexual Activity  Alcohol Use Yes    Social History   Tobacco Use  Smoking Status Never Smoker  Smokeless Tobacco Never Used    Review of Systems  Constitutional: Positive for chills and diaphoresis.  HENT: Negative.   Eyes: Negative.   Respiratory: Negative.   Cardiovascular: Negative.   Gastrointestinal: Negative.   Genitourinary: Negative.   Musculoskeletal: Negative.   Skin: Negative.   Neurological: Negative.   Endo/Heme/Allergies: Negative.   Psychiatric/Behavioral: Negative.     Objective   Vitals:   10/18/18 1132  BP: (!) 152/89  Pulse: (!) 111  Resp: 16  Temp: (!) 96.9 F (36.1 C)    Physical Exam Vitals signs reviewed.  Constitutional:      Appearance: Normal appearance. She is normal weight. She is not ill-appearing.  HENT:     Head: Normocephalic and atraumatic.  Neck:     Musculoskeletal: Normal range of motion and neck supple.  Cardiovascular:     Rate and Rhythm: Normal rate and regular rhythm.     Heart sounds: Normal heart sounds. No murmur. No friction rub. No gallop.   Pulmonary:     Effort: Pulmonary effort is normal. No respiratory distress.     Breath sounds: Normal breath sounds. No stridor. No wheezing, rhonchi or rales.  Lymphadenopathy:  Cervical: Cervical adenopathy present.  Skin:    General: Skin is warm and dry.  Neurological:     Mental Status: She is alert and oriented to person, place, and time.   Lymph nodes: Small bilateral cervical and supraclavicular lymphadenopathy noted.  Shotty left axillary lymphadenopathy noted.  PET scan images personally reviewed  Assessment  Generalized lymphadenopathy Plan   Patient is scheduled for a left axillary lymph node biopsy on 10/22/2018.  The risks and benefits of the procedure including bleeding, infection, and insufficient tissue were fully explained to the patient, who gave informed consent.  This will be  sent off for flow cytometry.  I did review the images with the patient and husband.  

## 2018-10-19 ENCOUNTER — Encounter (HOSPITAL_COMMUNITY)
Admission: RE | Admit: 2018-10-19 | Discharge: 2018-10-19 | Disposition: A | Discharge status: Home / Self Care | Payer: Managed Care, Other (non HMO) | Source: Ambulatory Visit | Attending: General Surgery | Admitting: General Surgery

## 2018-10-22 ENCOUNTER — Ambulatory Visit (HOSPITAL_COMMUNITY)
Admission: RE | Admit: 2018-10-22 | Discharge: 2018-10-22 | Disposition: A | Discharge status: Home / Self Care | Payer: Managed Care, Other (non HMO) | Attending: General Surgery | Admitting: General Surgery

## 2018-10-22 ENCOUNTER — Ambulatory Visit (HOSPITAL_COMMUNITY): Payer: Managed Care, Other (non HMO) | Admitting: Anesthesiology

## 2018-10-22 ENCOUNTER — Encounter (HOSPITAL_COMMUNITY)
Admission: RE | Disposition: A | Discharge status: Home / Self Care | Payer: Self-pay | Source: Home / Self Care | Attending: General Surgery

## 2018-10-22 ENCOUNTER — Encounter (HOSPITAL_COMMUNITY): Payer: Self-pay | Admitting: Anesthesiology

## 2018-10-22 ENCOUNTER — Other Ambulatory Visit: Payer: Self-pay

## 2018-10-22 DIAGNOSIS — Z793 Long term (current) use of hormonal contraceptives: Secondary | ICD-10-CM | POA: Insufficient documentation

## 2018-10-22 DIAGNOSIS — Z881 Allergy status to other antibiotic agents status: Secondary | ICD-10-CM | POA: Diagnosis not present

## 2018-10-22 DIAGNOSIS — Z79899 Other long term (current) drug therapy: Secondary | ICD-10-CM | POA: Insufficient documentation

## 2018-10-22 DIAGNOSIS — E119 Type 2 diabetes mellitus without complications: Secondary | ICD-10-CM | POA: Insufficient documentation

## 2018-10-22 DIAGNOSIS — Z7984 Long term (current) use of oral hypoglycemic drugs: Secondary | ICD-10-CM | POA: Insufficient documentation

## 2018-10-22 DIAGNOSIS — F329 Major depressive disorder, single episode, unspecified: Secondary | ICD-10-CM | POA: Insufficient documentation

## 2018-10-22 DIAGNOSIS — Z880 Allergy status to penicillin: Secondary | ICD-10-CM | POA: Insufficient documentation

## 2018-10-22 DIAGNOSIS — R591 Generalized enlarged lymph nodes: Secondary | ICD-10-CM

## 2018-10-22 DIAGNOSIS — F419 Anxiety disorder, unspecified: Secondary | ICD-10-CM | POA: Diagnosis not present

## 2018-10-22 HISTORY — PX: AXILLARY LYMPH NODE BIOPSY: SHX5737

## 2018-10-22 LAB — GLUCOSE, CAPILLARY
Glucose-Capillary: 162 mg/dL — ABNORMAL HIGH (ref 70–99)
Glucose-Capillary: 170 mg/dL — ABNORMAL HIGH (ref 70–99)

## 2018-10-22 SURGERY — AXILLARY LYMPH NODE BIOPSY
Anesthesia: General | Site: Axilla | Laterality: Left

## 2018-10-22 MED ORDER — MEPERIDINE HCL 50 MG/ML IJ SOLN: 6.2500 mg | INTRAMUSCULAR | Status: DC | PRN

## 2018-10-22 MED ORDER — EPHEDRINE 5 MG/ML INJ
INTRAVENOUS | Status: AC
  Filled 2018-10-22: qty 10

## 2018-10-22 MED ORDER — GLYCOPYRROLATE PF 0.2 MG/ML IJ SOSY
PREFILLED_SYRINGE | INTRAMUSCULAR | Status: AC
  Filled 2018-10-22: qty 1

## 2018-10-22 MED ORDER — ONDANSETRON HCL 4 MG/2ML IJ SOLN
INTRAMUSCULAR | Status: DC | PRN
  Administered 2018-10-22: 4 mg via INTRAVENOUS

## 2018-10-22 MED ORDER — CHLORHEXIDINE GLUCONATE CLOTH 2 % EX PADS: 6.0000 | MEDICATED_PAD | Freq: Once | CUTANEOUS | Status: DC

## 2018-10-22 MED ORDER — ONDANSETRON HCL 4 MG/2ML IJ SOLN: 4.0000 mg | Freq: Once | INTRAMUSCULAR | Status: DC | PRN

## 2018-10-22 MED ORDER — HYDROCODONE-ACETAMINOPHEN 5-325 MG PO TABS: 1.0000 | ORAL_TABLET | ORAL | 0 refills | Status: DC | PRN

## 2018-10-22 MED ORDER — KETOROLAC TROMETHAMINE 30 MG/ML IJ SOLN
30.0000 mg | Freq: Once | INTRAMUSCULAR | Status: AC | PRN
  Administered 2018-10-22: 30 mg via INTRAVENOUS

## 2018-10-22 MED ORDER — LIDOCAINE 2% (20 MG/ML) 5 ML SYRINGE
INTRAMUSCULAR | Status: DC | PRN
  Administered 2018-10-22: 40 mg via INTRAVENOUS

## 2018-10-22 MED ORDER — 0.9 % SODIUM CHLORIDE (POUR BTL) OPTIME
TOPICAL | Status: DC | PRN
  Administered 2018-10-22: 1000 mL

## 2018-10-22 MED ORDER — HYDROMORPHONE HCL 1 MG/ML IJ SOLN: 0.2500 mg | INTRAMUSCULAR | Status: DC | PRN

## 2018-10-22 MED ORDER — KETOROLAC TROMETHAMINE 30 MG/ML IJ SOLN
30.0000 mg | Freq: Once | INTRAMUSCULAR | Status: DC
  Filled 2018-10-22: qty 1

## 2018-10-22 MED ORDER — PROPOFOL 10 MG/ML IV BOLUS
INTRAVENOUS | Status: DC | PRN
  Administered 2018-10-22: 160 mg via INTRAVENOUS

## 2018-10-22 MED ORDER — DEXAMETHASONE SODIUM PHOSPHATE 4 MG/ML IJ SOLN
INTRAMUSCULAR | Status: DC | PRN
  Administered 2018-10-22: 8 mg via INTRAVENOUS

## 2018-10-22 MED ORDER — ONDANSETRON HCL 4 MG/2ML IJ SOLN
INTRAMUSCULAR | Status: AC
  Filled 2018-10-22: qty 2

## 2018-10-22 MED ORDER — LACTATED RINGERS IV SOLN
INTRAVENOUS | Status: DC
  Administered 2018-10-22: 10:00:00 via INTRAVENOUS

## 2018-10-22 MED ORDER — BUPIVACAINE HCL (PF) 0.5 % IJ SOLN
INTRAMUSCULAR | Status: AC
  Filled 2018-10-22: qty 30

## 2018-10-22 MED ORDER — FENTANYL CITRATE (PF) 250 MCG/5ML IJ SOLN
INTRAMUSCULAR | Status: AC
  Filled 2018-10-22: qty 5

## 2018-10-22 MED ORDER — LIDOCAINE 2% (20 MG/ML) 5 ML SYRINGE
INTRAMUSCULAR | Status: AC
  Filled 2018-10-22: qty 5

## 2018-10-22 MED ORDER — PROPOFOL 10 MG/ML IV BOLUS
INTRAVENOUS | Status: AC
  Filled 2018-10-22: qty 20

## 2018-10-22 MED ORDER — BUPIVACAINE HCL (PF) 0.5 % IJ SOLN
INTRAMUSCULAR | Status: DC | PRN
  Administered 2018-10-22: 9 mL

## 2018-10-22 MED ORDER — HYDROCODONE-ACETAMINOPHEN 7.5-325 MG PO TABS: 1.0000 | ORAL_TABLET | Freq: Once | ORAL | Status: DC | PRN

## 2018-10-22 MED ORDER — FENTANYL CITRATE (PF) 100 MCG/2ML IJ SOLN
INTRAMUSCULAR | Status: DC | PRN
  Administered 2018-10-22 (×3): 50 ug via INTRAVENOUS
  Administered 2018-10-22 (×2): 25 ug via INTRAVENOUS
  Administered 2018-10-22: 50 ug via INTRAVENOUS

## 2018-10-22 SURGICAL SUPPLY — 35 items
ADH SKN CLS APL DERMABOND .7 (GAUZE/BANDAGES/DRESSINGS) ×1
APPLIER CLIP 9.375 SM OPEN (CLIP) ×3
APR CLP SM 9.3 20 MLT OPN (CLIP) ×1
BLADE SURG 15 STRL LF DISP TIS (BLADE) ×1 IMPLANT
BLADE SURG 15 STRL SS (BLADE) ×3
CHLORAPREP W/TINT 26ML (MISCELLANEOUS) ×3 IMPLANT
CLIP APPLIE 9.375 SM OPEN (CLIP) IMPLANT
CLOTH BEACON ORANGE TIMEOUT ST (SAFETY) ×3 IMPLANT
CONT SPEC 4OZ CLIKSEAL STRL BL (MISCELLANEOUS) ×3 IMPLANT
COVER LIGHT HANDLE STERIS (MISCELLANEOUS) ×6 IMPLANT
DECANTER SPIKE VIAL GLASS SM (MISCELLANEOUS) ×3 IMPLANT
DERMABOND ADVANCED (GAUZE/BANDAGES/DRESSINGS) ×2
DERMABOND ADVANCED .7 DNX12 (GAUZE/BANDAGES/DRESSINGS) ×1 IMPLANT
ELECT REM PT RETURN 9FT ADLT (ELECTROSURGICAL) ×3
ELECTRODE REM PT RTRN 9FT ADLT (ELECTROSURGICAL) ×1 IMPLANT
GLOVE BIOGEL PI IND STRL 7.0 (GLOVE) ×1 IMPLANT
GLOVE BIOGEL PI INDICATOR 7.0 (GLOVE) ×6
GLOVE SURG SS PI 7.0 STRL IVOR (GLOVE) ×4 IMPLANT
GLOVE SURG SS PI 7.5 STRL IVOR (GLOVE) ×3 IMPLANT
GOWN STRL REUS W/TWL LRG LVL3 (GOWN DISPOSABLE) ×6 IMPLANT
INST SET MINOR GENERAL (KITS) ×3 IMPLANT
KIT TURNOVER KIT A (KITS) ×3 IMPLANT
MANIFOLD NEPTUNE II (INSTRUMENTS) ×3 IMPLANT
NDL HYPO 25X1 1.5 SAFETY (NEEDLE) ×1 IMPLANT
NEEDLE HYPO 25X1 1.5 SAFETY (NEEDLE) ×3 IMPLANT
NS IRRIG 1000ML POUR BTL (IV SOLUTION) ×3 IMPLANT
PACK MINOR (CUSTOM PROCEDURE TRAY) ×3 IMPLANT
PAD ARMBOARD 7.5X6 YLW CONV (MISCELLANEOUS) ×3 IMPLANT
SET BASIN LINEN APH (SET/KITS/TRAYS/PACK) ×3 IMPLANT
SPONGE LAP 18X18 RF (DISPOSABLE) ×3 IMPLANT
SUT MNCRL AB 4-0 PS2 18 (SUTURE) ×3 IMPLANT
SUT VIC AB 3-0 SH 27 (SUTURE) ×3
SUT VIC AB 3-0 SH 27X BRD (SUTURE) ×1 IMPLANT
SYR BULB IRRIGATION 50ML (SYRINGE) ×2 IMPLANT
SYR CONTROL 10ML LL (SYRINGE) ×3 IMPLANT

## 2018-10-22 NOTE — Transfer of Care (Signed)
Immediate Anesthesia Transfer of Care Note  Patient: Crystal Mccoy  Procedure(s) Performed: AXILLARY LYMPH NODE BIOPSY (Left Axilla)  Patient Location: PACU  Anesthesia Type:General  Level of Consciousness: awake, alert , oriented and patient cooperative  Airway & Oxygen Therapy: Patient Spontanous Breathing  Post-op Assessment: Report given to RN and Post -op Vital signs reviewed and stable  Post vital signs: Reviewed and stable  Last Vitals:  Vitals Value Taken Time  BP    Temp    Pulse    Resp    SpO2      Last Pain:  Vitals:   10/22/18 1011  TempSrc: Oral  PainSc: 0-No pain         Complications: No apparent anesthesia complications

## 2018-10-22 NOTE — Interval H&P Note (Signed)
History and Physical Interval Note:  10/22/2018 10:14 AM  Crystal Mccoy  has presented today for surgery, with the diagnosis of lymphadenopathy  The various methods of treatment have been discussed with the patient and family. After consideration of risks, benefits and other options for treatment, the patient has consented to  Procedure(s): AXILLARY LYMPH NODE BIOPSY (Left) as a surgical intervention .  The patient's history has been reviewed, patient examined, no change in status, stable for surgery.  I have reviewed the patient's chart and labs.  Questions were answered to the patient's satisfaction.     Franky Macho

## 2018-10-22 NOTE — Progress Notes (Signed)
Pt dc from PACU

## 2018-10-22 NOTE — Anesthesia Procedure Notes (Signed)
Procedure Name: LMA Insertion Date/Time: 10/22/2018 10:31 AM Performed by: Curly Mackowski A, CRNA Pre-anesthesia Checklist: Patient identified, Emergency Drugs available, Suction available, Patient being monitored and Timeout performed Patient Re-evaluated:Patient Re-evaluated prior to induction Oxygen Delivery Method: Circle system utilized Preoxygenation: Pre-oxygenation with 100% oxygen Induction Type: IV induction LMA: LMA inserted LMA Size: 4.0 Number of attempts: 1 Placement Confirmation: positive ETCO2 and breath sounds checked- equal and bilateral Tube secured with: Tape Dental Injury: Teeth and Oropharynx as per pre-operative assessment

## 2018-10-22 NOTE — Discharge Instructions (Signed)
Monitored Anesthesia Care, Care After These instructions provide you with information about caring for yourself after your procedure. Your health care provider may also give you more specific instructions. Your treatment has been planned according to current medical practices, but problems sometimes occur. Call your health care provider if you have any problems or questions after your procedure. What can I expect after the procedure? After your procedure, you may:  Feel sleepy for several hours.  Feel clumsy and have poor balance for several hours.  Feel forgetful about what happened after the procedure.  Have poor judgment for several hours.  Feel nauseous or vomit.  Have a sore throat if you had a breathing tube during the procedure. Follow these instructions at home: For at least 24 hours after the procedure:      Have a responsible adult stay with you. It is important to have someone help care for you until you are awake and alert.  Rest as needed.  Do not: ? Participate in activities in which you could fall or become injured. ? Drive. ? Use heavy machinery. ? Drink alcohol. ? Take sleeping pills or medicines that cause drowsiness. ? Make important decisions or sign legal documents. ? Take care of children on your own. Eating and drinking  Follow the diet that is recommended by your health care provider.  If you vomit, drink water, juice, or soup when you can drink without vomiting.  Make sure you have little or no nausea before eating solid foods. General instructions  Take over-the-counter and prescription medicines only as told by your health care provider.  If you have sleep apnea, surgery and certain medicines can increase your risk for breathing problems. Follow instructions from your health care provider about wearing your sleep device: ? Anytime you are sleeping, including during daytime naps. ? While taking prescription pain medicines, sleeping  medicines, or medicines that make you drowsy.  If you smoke, do not smoke without supervision.  Keep all follow-up visits as told by your health care provider. This is important. Contact a health care provider if:  You keep feeling nauseous or you keep vomiting.  You feel light-headed.  You develop a rash.  You have a fever. Get help right away if:  You have trouble breathing. Summary  For several hours after your procedure, you may feel sleepy and have poor judgment.  Have a responsible adult stay with you for at least 24 hours or until you are awake and alert. This information is not intended to replace advice given to you by your health care provider. Make sure you discuss any questions you have with your health care provider. Document Released: 12/27/2015 Document Revised: 04/21/2017 Document Reviewed: 12/27/2015 Elsevier Interactive Patient Education  2019 [employer]  Open Lymph Node Biopsy, Care After This sheet gives you information about how to care for yourself after your procedure. Your health care provider may also give you more specific instructions. If you have problems or questions, contact your health care provider. What can I expect after the procedure? After the procedure, it is common to have:  Bruising.  Soreness.  Mild swelling. Follow these instructions at home: Medicines  Take over-the-counter and prescription medicines only as told by your health care provider.  If you were prescribed an antibiotic medicine, take it as told by your health care provider. Do not stop taking the antibiotic even if you start to feel better. Incision care   Follow instructions from your health care provider about  how to take care of your incision. Make sure you: ? Wash your hands with soap and water before and after you change your bandage (dressing). If soap and water are not available, use hand sanitizer. ? Change your dressing as told by your health care  provider. ? Leave stitches (sutures), skin glue, or adhesive strips in place. These skin closures may need to stay in place for 2 weeks or longer. If adhesive strip edges start to loosen and curl up, you may trim the loose edges. Do not remove adhesive strips completely unless your health care provider tells you to do that.  Check your incision area every day for signs of infection. Check for: ? More redness, swelling, or pain. ? Fluid or blood. ? Warmth. ? Pus or a bad smell. Driving  Do not drive for 24 hours if you were given a sedative during your procedure.  Do not drive or use heavy machinery while taking prescription pain medicine. General instructions  Return to your normal activities as told by your health care provider. Ask your health care provider what activities are safe for you.  Do not take baths, swim, or use a hot tub until your health care provider approves. Ask your health care provider if you may take showers. You may only be allowed to take sponge baths.  Keep all follow-up visits as told by your health care provider. This is important. Contact a health care provider if:  You have more redness, swelling, or pain around your incision.  You have fluid or blood coming from your incision.  Your incision feels warm to the touch.  You have pus or a bad smell coming from your incision.  You have a fever.  You have pain or numbness that gets worse or lasts longer than a few days. Summary  After a lymph node biopsy, it is common to have bruising, soreness, and mild swelling.  Follow your health care provider's instructions about taking care of yourself at home. You will be told how to take medicines, take care of your incision, and check for infection.  Return to your normal activities as told by your health care provider. Ask your health care provider what activities are safe for you.  Contact a health care provider if you have more redness, swelling, or pain  around your incision, you have a fever, or you have worsening pain or numbness. This information is not intended to replace advice given to you by your health care provider. Make sure you discuss any questions you have with your health care provider. Document Released: 10/02/2015 Document Revised: 04/12/2018 Document Reviewed: 04/12/2018 Elsevier Interactive Patient Education  2019 [employer]

## 2018-10-22 NOTE — Anesthesia Preprocedure Evaluation (Signed)
Anesthesia Evaluation  Patient identified by MRN, date of birth, ID band Patient awake    Reviewed: Allergy & Precautions, H&P , NPO status , Patient's Chart, lab work & pertinent test results  Airway Mallampati: II  TM Distance: >3 FB Neck ROM: full    Dental no notable dental hx.    Pulmonary neg pulmonary ROS,    Pulmonary exam normal breath sounds clear to auscultation       Cardiovascular Exercise Tolerance: Good negative cardio ROS   Rhythm:regular Rate:Normal     Neuro/Psych PSYCHIATRIC DISORDERS Anxiety Depression negative neurological ROS     GI/Hepatic negative GI ROS, Neg liver ROS,   Endo/Other  negative endocrine ROSdiabetes  Renal/GU negative Renal ROS  negative genitourinary   Musculoskeletal   Abdominal   Peds  Hematology negative hematology ROS (+) lymphadenopathy   Anesthesia Other Findings   Reproductive/Obstetrics negative OB ROS                             Anesthesia Physical Anesthesia Plan  ASA: III  Anesthesia Plan: General   Post-op Pain Management:    Induction:   PONV Risk Score and Plan:   Airway Management Planned:   Additional Equipment:   Intra-op Plan:   Post-operative Plan:   Informed Consent: I have reviewed the patients History and Physical, chart, labs and discussed the procedure including the risks, benefits and alternatives for the proposed anesthesia with the patient or authorized representative who has indicated his/her understanding and acceptance.     Dental Advisory Given  Plan Discussed with: CRNA  Anesthesia Plan Comments:         Anesthesia Quick Evaluation

## 2018-10-22 NOTE — H&P (Signed)
Crystal Mccoy; [id number]; [birth year]   HPI Patient is a 38 year old white female who was referred to my care by Dr. Ellin Saba for lymph node biopsy.  Patient has generalized lymphadenopathy and has been referred for a lymph node biopsy to rule out lymphoma.  Patient has been having some night sweats.  Patient has 0 out of 10 pain.  She underwent a PET scan yesterday and she does have cervical, mediastinal, upper abdominal, and axillary lymphadenopathy. Past Medical History:  Diagnosis Date  . Anxiety   . Depression   . Diabetes mellitus without complication [facility])     Past Surgical History:  Procedure Laterality Date  . CESAREAN SECTION    . TONSILLECTOMY      Family History  Problem Relation Age of Onset  . Hypertension Father   . Stroke Father   . Cancer - Other Father   . Seizures Father   . Diabetes Father   . Heart attack Father   . Cancer Brother   . Heart attack Paternal Uncle   . Heart attack Paternal Grandfather     Current Outpatient Medications on File Prior to Visit  Medication Sig Dispense Refill  . desvenlafaxine (PRISTIQ) 50 MG 24 hr tablet Take 50 mg by mouth daily.    Marland Kitchen liraglutide (VICTOZA) 18 MG/3ML SOPN Inject 1.8 mg into the skin.    . Norgestimate-Ethinyl Estradiol Triphasic (ORTHO TRI-CYCLEN LO) 0.18/0.215/0.25 MG-25 MCG tab Take 1 tablet by mouth daily.     No current facility-administered medications on file prior to visit.     Allergies  Allergen Reactions  . Omniflox [Temafloxacin]   . Latex Rash    Only when on face  . Penicillins Rash    Has patient had a PCN reaction causing immediate rash, facial/tongue/throat swelling, SOB or lightheadedness with hypotension: Yes Has patient had a PCN reaction causing severe rash involving mucus membranes or skin necrosis: No Has patient had a PCN reaction that required hospitalization No Has patient had a PCN reaction occurring within the last 10 years: No If all of the above answers are "NO", then may  proceed with Cephalosporin use.     Social History   Substance and Sexual Activity  Alcohol Use Yes    Social History   Tobacco Use  Smoking Status Never Smoker  Smokeless Tobacco Never Used    Review of Systems  Constitutional: Positive for chills and diaphoresis.  HENT: Negative.   Eyes: Negative.   Respiratory: Negative.   Cardiovascular: Negative.   Gastrointestinal: Negative.   Genitourinary: Negative.   Musculoskeletal: Negative.   Skin: Negative.   Neurological: Negative.   Endo/Heme/Allergies: Negative.   Psychiatric/Behavioral: Negative.     Objective   Vitals:   10/18/18 1132  BP: (!) 152/89  Pulse: (!) 111  Resp: 16  Temp: (!) 96.9 F (36.1 C)    Physical Exam Vitals signs reviewed.  Constitutional:      Appearance: Normal appearance. She is normal weight. She is not ill-appearing.  HENT:     Head: Normocephalic and atraumatic.  Neck:     Musculoskeletal: Normal range of motion and neck supple.  Cardiovascular:     Rate and Rhythm: Normal rate and regular rhythm.     Heart sounds: Normal heart sounds. No murmur. No friction rub. No gallop.   Pulmonary:     Effort: Pulmonary effort is normal. No respiratory distress.     Breath sounds: Normal breath sounds. No stridor. No wheezing, rhonchi or rales.  Lymphadenopathy:  Cervical: Cervical adenopathy present.  Skin:    General: Skin is warm and dry.  Neurological:     Mental Status: She is alert and oriented to person, place, and time.   Lymph nodes: Small bilateral cervical and supraclavicular lymphadenopathy noted.  Shotty left axillary lymphadenopathy noted.  PET scan images personally reviewed  Assessment  Generalized lymphadenopathy Plan   Patient is scheduled for a left axillary lymph node biopsy on 10/22/2018.  The risks and benefits of the procedure including bleeding, infection, and insufficient tissue were fully explained to the patient, who gave informed consent.  This will be  sent off for flow cytometry.  I did review the images with the patient and husband.

## 2018-10-22 NOTE — Op Note (Signed)
Patient:  Crystal Mccoy  DOB:  26-Jun-1981  MRN:  [id number]   Preop Diagnosis: Generalized lymphadenopathy, rule out lymphoma  Postop Diagnosis: Same  Procedure: Left axillary lymph node biopsy  Surgeon: Franky Macho, MD  Anes: General  Indications: Patient is a 38 year old white female with generalized lymphadenopathy who now presents for left axillary lymph node biopsy.  The risks and benefits of the procedure including bleeding, infection, and the possibility of a seroma were fully explained to the patient, who gave informed consent.  Procedure note: The patient was placed in the supine position.  After general anesthesia was administered, the left axilla was prepped and draped using usual sterile technique with DuraPrep.  Surgical site confirmation was performed.  Incision was made in the left axilla.  The dissection was taken down to the axillary contents.  Care was taken to avoid the neurovascular structures.  A large up to 2 cm lobulated necrotic matted lymph node was noted.  This was removed without difficulty.  A bleeding was controlled using small clips.  The lymph node was sent fresh for flow cytometry.  The subcutaneous layer was reapproximated using a 3-0 Vicryl interrupted suture.  0.5% Sensorcaine was instilled in surrounding wound.  The skin was closed using a 4-0 Monocryl subcuticular suture.  Dermabond was applied.  All tape and needle counts were correct at the end of the procedure.  Patient was awakened and transferred to PACU in stable condition.  Complications: None  EBL: Minimal  Specimen: Left axillary lymph node

## 2018-10-22 NOTE — Addendum Note (Signed)
Addendum  created 10/22/18 1226 by Earleen Newport, CRNA   Intraprocedure Meds edited

## 2018-10-22 NOTE — Anesthesia Postprocedure Evaluation (Signed)
Anesthesia Post Note Late Entry for 1135  Patient: Crystal Mccoy  Procedure(s) Performed: AXILLARY LYMPH NODE BIOPSY (Left Axilla)  Patient location during evaluation: PACU Anesthesia Type: General Level of consciousness: awake and alert and oriented Pain management: pain level controlled Vital Signs Assessment: post-procedure vital signs reviewed and stable Respiratory status: spontaneous breathing Cardiovascular status: stable Postop Assessment: no apparent nausea or vomiting Anesthetic complications: no     Last Vitals:  Vitals:   10/22/18 1150 10/22/18 1200  BP: 128/72 (!) 126/56  Pulse: 90 85  Resp: 17 17  Temp:    SpO2: 95% 97%    Last Pain:  Vitals:   10/22/18 1156  TempSrc:   PainSc: 5                  Jadarion Halbig A

## 2018-10-23 ENCOUNTER — Ambulatory Visit: Payer: Managed Care, Other (non HMO) | Admitting: General Surgery

## 2018-10-23 ENCOUNTER — Encounter (HOSPITAL_COMMUNITY): Payer: Self-pay | Admitting: General Surgery

## 2018-10-25 ENCOUNTER — Ambulatory Visit: Payer: Managed Care, Other (non HMO) | Admitting: General Surgery

## 2018-10-26 ENCOUNTER — Encounter (HOSPITAL_COMMUNITY): Payer: Self-pay | Admitting: Hematology

## 2018-10-26 ENCOUNTER — Inpatient Hospital Stay (HOSPITAL_COMMUNITY): Payer: Managed Care, Other (non HMO) | Attending: Hematology | Admitting: Hematology

## 2018-10-26 ENCOUNTER — Ambulatory Visit (HOSPITAL_COMMUNITY): Payer: Self-pay | Admitting: Hematology

## 2018-10-26 ENCOUNTER — Other Ambulatory Visit: Payer: Self-pay

## 2018-10-26 ENCOUNTER — Encounter (HOSPITAL_COMMUNITY): Payer: Self-pay

## 2018-10-26 DIAGNOSIS — K30 Functional dyspepsia: Secondary | ICD-10-CM | POA: Insufficient documentation

## 2018-10-26 DIAGNOSIS — K5900 Constipation, unspecified: Secondary | ICD-10-CM | POA: Insufficient documentation

## 2018-10-26 DIAGNOSIS — R61 Generalized hyperhidrosis: Secondary | ICD-10-CM | POA: Insufficient documentation

## 2018-10-26 DIAGNOSIS — Z79899 Other long term (current) drug therapy: Secondary | ICD-10-CM | POA: Insufficient documentation

## 2018-10-26 DIAGNOSIS — Z8042 Family history of malignant neoplasm of prostate: Secondary | ICD-10-CM | POA: Insufficient documentation

## 2018-10-26 DIAGNOSIS — E119 Type 2 diabetes mellitus without complications: Secondary | ICD-10-CM | POA: Insufficient documentation

## 2018-10-26 DIAGNOSIS — R591 Generalized enlarged lymph nodes: Secondary | ICD-10-CM | POA: Insufficient documentation

## 2018-10-26 NOTE — Assessment & Plan Note (Signed)
1.  Generalized lymphadenopathy: - She had severe night sweats and fevers since the last 4 to 5 weeks.  Denies any weight loss. -She also had low back pain radiating to the right leg on recent admission but it has completely subsided. - She was admitted to the hospital from 09/27/2018 through 10/01/2018 and underwent extensive work-up for fevers.  Infectious causes were ruled out.  Denies any tick bites.  Has 2 pet dogs. - Connective tissue disorder work-up was also negative. - CT scan of the CAP done on 09/28/2018 showed 1 cm anterior mediastinal lymph node.  There is also adenopathy in the upper abdomen around the GE junction and between portal vein and IVC where there is a 5.2 cm lymph node present.  There is a 1.2 cm lymph node near the hepatic hilum.  Brain MRI was negative. -LDH was elevated.  She also had mild elevation of AST and ALT. -Reports feeling heaviness in the stomach after eating meats and peanut butter, which lasts about 30 minutes.  Denies any nausea, vomiting or regurgitation. - She had left axillary lymph node excision biopsy on 10/22/2018. - We have discussed the findings of the PET CT scan dated 10/17/2018.  Bilateral hypermetabolic level 2 neck lymph nodes, subcentimeter with an SUV of 4.3.  Bilateral axillary nodes with left axillary node SUV of 4.7.  Hypermetabolic and enlarged spleen present.  Spleen measures 13.1 x 5.9 x 12.8 cm.  Volume is 520 cm.  There is a cluster of hypermetabolic lymph nodes in the porta hepatis, 15 mm short axis with SUV max of 7.6.  Mildly hypermetabolic external iliac lymph and right inguinal nodes with SUV of 2.7. -I have called and talked to our pathologist Dr. Berneice Heinrich.  She did not see any evidence of lymphoma.  Flow cytometry of the lymph node did not show any monoclonal B-cell or phenotypically aberrant T-cell population.  She think it stained for latent EBV.  Her EBV DNA is around 400. -I will reach out to our GI colleagues to see if they can do EUS  biopsy of the porta hepatis lymph nodes, as it has high likelihood of diagnostic yield. - She reports that fevers have subsided for the past 1 week.  However she continues to feel very weak and has night sweats.  2.  Family history: -Father had liver cancer. -Brother had prostate cancer.

## 2018-10-26 NOTE — Patient Instructions (Signed)
[facility] at [facility] Discharge Instructions     Thank you for choosing [facility] at [facility] to provide your oncology and hematology care.  To afford each patient quality time with our provider, please arrive at least 15 minutes before your scheduled appointment time.   If you have a lab appointment with the Cancer Center please come in thru the  Main Entrance and check in at the main information desk  You need to re-schedule your appointment should you arrive 10 or more minutes late.  We strive to give you quality time with our providers, and arriving late affects you and other patients whose appointments are after yours.  Also, if you no show three or more times for appointments you may be dismissed from the clinic at the providers discretion.     Again, thank you for choosing [facility].  Our hope is that these requests will decrease the amount of time that you wait before being seen by our physicians.       _____________________________________________________________  Should you have questions after your visit to  Cancer Center, please contact our office at [phone] between the hours of 8:00 a.m. and 4:30 p.m.  Voicemails left after 4:00 p.m. will not be returned until the following business day.  For prescription refill requests, have your pharmacy contact our office and allow 72 hours.    Cancer Center Support Programs:   > Cancer Support Group  2nd Tuesday of the month 1pm-2pm, Journey Room    

## 2018-10-26 NOTE — Progress Notes (Signed)
[facility] [address], [place] [address]   CLINIC:  Medical Oncology/Hematology  PCP:  Patient, No Pcp Per No address on file None   REASON FOR VISIT: Follow-up for lymphadenopathy   CURRENT THERAPY: work-up   INTERVAL HISTORY:  [name] 38 y.o. female returns for routine follow-up for lymphadenopathy. She is here today with her husband and family. She does report indigestion and night sweats. She is no longer running a fevers. She has occasional constipation. Denies any nausea, vomiting, or diarrhea. Denies any new pains. Had not noticed any recent bleeding such as epistaxis, hematuria or hematochezia. Denies recent chest pain on exertion, shortness of breath on minimal exertion, pre-syncopal episodes, or palpitations. Denies any numbness or tingling in hands or feet. Denies any recent fevers, infections, or recent hospitalizations. Patient reports appetite at 75% and energy level at 50%.   REVIEW OF SYSTEMS:  Review of Systems  Constitutional: Positive for fatigue.       Night sweats  Gastrointestinal: Positive for constipation.  All other systems reviewed and are negative.    PAST MEDICAL/SURGICAL HISTORY:  Past Medical History:  Diagnosis Date  . Anxiety   . Depression   . Diabetes mellitus without complication [facility])    Past Surgical History:  Procedure Laterality Date  . AXILLARY LYMPH NODE BIOPSY Left 10/22/2018   Procedure: AXILLARY LYMPH NODE BIOPSY;  Surgeon: Franky Macho, MD;  Location: [facility];  Service: General;  Laterality: Left;  . CESAREAN SECTION    . TONSILLECTOMY       SOCIAL HISTORY:  Social History   Socioeconomic History  . Marital status: Married    Spouse name: Not on file  . Number of children: Not on file  . Years of education: Not on file  . Highest education level: Not on file  Occupational History  . Not on file  Social Needs  . Financial resource strain: Not on file  . Food insecurity:    Worry: Not on  file    Inability: Not on file  . Transportation needs:    Medical: Not on file    Non-medical: Not on file  Tobacco Use  . Smoking status: Never Smoker  . Smokeless tobacco: Never Used  Substance and Sexual Activity  . Alcohol use: Yes  . Drug use: No  . Sexual activity: Yes  Lifestyle  . Physical activity:    Days per week: Not on file    Minutes per session: Not on file  . Stress: Not on file  Relationships  . Social connections:    Talks on phone: Not on file    Gets together: Not on file    Attends religious service: Not on file    Active member of club or organization: Not on file    Attends meetings of clubs or organizations: Not on file    Relationship status: Not on file  . Intimate partner violence:    Fear of current or ex partner: Not on file    Emotionally abused: Not on file    Physically abused: Not on file    Forced sexual activity: Not on file  Other Topics Concern  . Not on file  Social History Narrative  . Not on file    FAMILY HISTORY:  Family History  Problem Relation Age of Onset  . Hypertension Father   . Stroke Father   . Cancer - Other Father   . Seizures Father   . Diabetes Father   .  Heart attack Father   . Cancer Brother   . Heart attack Paternal Uncle   . Heart attack Paternal Grandfather     CURRENT MEDICATIONS:  Outpatient Encounter Medications as of 10/26/2018  Medication Sig  . desvenlafaxine (PRISTIQ) 50 MG 24 hr tablet Take 50 mg by mouth daily.  Marland Kitchen HYDROcodone-acetaminophen (NORCO) 5-325 MG tablet Take 1 tablet by mouth every 4 (four) hours as needed for moderate pain.  Marland Kitchen liraglutide (VICTOZA) 18 MG/3ML SOPN Inject 1.8 mg into the skin.  . [DISCONTINUED] Norgestimate-Ethinyl Estradiol Triphasic (ORTHO TRI-CYCLEN LO) 0.18/0.215/0.25 MG-25 MCG tab Take 1 tablet by mouth daily.   No facility-administered encounter medications on file as of 10/26/2018.     ALLERGIES:  Allergies  Allergen Reactions  . Omniflox [Temafloxacin]     . Latex Rash    Only when on face  . Penicillins Rash    Has patient had a PCN reaction causing immediate rash, facial/tongue/throat swelling, SOB or lightheadedness with hypotension: Yes Has patient had a PCN reaction causing severe rash involving mucus membranes or skin necrosis: No Has patient had a PCN reaction that required hospitalization No Has patient had a PCN reaction occurring within the last 10 years: No If all of the above answers are "NO", then may proceed with Cephalosporin use.      PHYSICAL EXAM:  ECOG Performance status: 1  Vitals:   10/26/18 0900  BP: (!) 161/85  Pulse: 98  Resp: 16  Temp: 98.5 F (36.9 C)  SpO2: 98%   Filed Weights   10/26/18 0900  Weight: 208 lb 5 oz (94.5 kg)    Physical Exam Constitutional:      Appearance: Normal appearance. She is normal weight.  Musculoskeletal: Normal range of motion.  Skin:    General: Skin is warm and dry.  Neurological:     Mental Status: She is alert and oriented to person, place, and time. Mental status is at baseline.  Psychiatric:        Mood and Affect: Mood normal.        Behavior: Behavior normal.        Thought Content: Thought content normal.        Judgment: Judgment normal.      LABORATORY DATA:  I have reviewed the labs as listed.  CBC    Component Value Date/Time   WBC 4.9 09/30/2018 0758   RBC 4.11 09/30/2018 0758   HGB 12.1 09/30/2018 0758   HCT 36.8 09/30/2018 0758   PLT 177 09/30/2018 0758   MCV 89.5 09/30/2018 0758   MCH 29.4 09/30/2018 0758   MCHC 32.9 09/30/2018 0758   RDW 13.1 09/30/2018 0758   LYMPHSABS 2.5 09/30/2018 0758   MONOABS 0.7 09/30/2018 0758   EOSABS 0.0 09/30/2018 0758   BASOSABS 0.1 09/30/2018 0758   CMP Latest Ref Rng & Units 10/05/2018 09/30/2018 09/29/2018  Glucose 70 - 99 mg/dL 696(E) 952(W) 413(K)  BUN 6 - 20 mg/dL 9 9 6   Creatinine 0.44 - 1.00 mg/dL 4.40 1.02 7.25  Sodium 135 - 145 mmol/L 133(L) 131(L) 134(L)  Potassium 3.5 - 5.1 mmol/L 4.3 3.7  4.0  Chloride 98 - 111 mmol/L 100 98 101  CO2 22 - 32 mmol/L 26 22 25   Calcium 8.9 - 10.3 mg/dL 8.9 3.6(U) 4.4(I)  Total Protein 6.5 - 8.1 g/dL 3.4(V) 7.7 7.1  Total Bilirubin 0.3 - 1.2 mg/dL 4.2(V) 1.2 0.7  Alkaline Phos 38 - 126 U/L 347(H) 143(H) 109  AST 15 - 41 U/L  83(H) 88(H) 82(H)  ALT 0 - 44 U/L 81(H) 82(H) 70(H)       DIAGNOSTIC IMAGING:  I have independently reviewed the scans and discussed with the patient.   I have reviewed Mathis Budandi Tess Potts, NP's note and agree with the documentation.  I personally performed a face-to-face visit, made revisions and my assessment and plan is as follows.    ASSESSMENT & PLAN:   Lymphadenopathy 1.  Generalized lymphadenopathy: - She had severe night sweats and fevers since the last 4 to 5 weeks.  Denies any weight loss. -She also had low back pain radiating to the right leg on recent admission but it has completely subsided. - She was admitted to the hospital from 09/27/2018 through 10/01/2018 and underwent extensive work-up for fevers.  Infectious causes were ruled out.  Denies any tick bites.  Has 2 pet dogs. - Connective tissue disorder work-up was also negative. - CT scan of the CAP done on 09/28/2018 showed 1 cm anterior mediastinal lymph node.  There is also adenopathy in the upper abdomen around the GE junction and between portal vein and IVC where there is a 5.2 cm lymph node present.  There is a 1.2 cm lymph node near the hepatic hilum.  Brain MRI was negative. -LDH was elevated.  She also had mild elevation of AST and ALT. -Reports feeling heaviness in the stomach after eating meats and peanut butter, which lasts about 30 minutes.  Denies any nausea, vomiting or regurgitation. - She had left axillary lymph node excision biopsy on 10/22/2018. - We have discussed the findings of the PET CT scan dated 10/17/2018.  Bilateral hypermetabolic level 2 neck lymph nodes, subcentimeter with an SUV of 4.3.  Bilateral axillary nodes with left axillary  node SUV of 4.7.  Hypermetabolic and enlarged spleen present.  Spleen measures 13.1 x 5.9 x 12.8 cm.  Volume is 520 cm.  There is a cluster of hypermetabolic lymph nodes in the porta hepatis, 15 mm short axis with SUV max of 7.6.  Mildly hypermetabolic external iliac lymph and right inguinal nodes with SUV of 2.7. -I have called and talked to our pathologist Dr. Berneice HeinrichManny.  She did not see any evidence of lymphoma.  Flow cytometry of the lymph node did not show any monoclonal B-cell or phenotypically aberrant T-cell population.  She think it stained for latent EBV.  Her EBV DNA is around 400. -I will reach out to our GI colleagues to see if they can do EUS biopsy of the porta hepatis lymph nodes, as it has high likelihood of diagnostic yield. - She reports that fevers have subsided for the past 1 week.  However she continues to feel very weak and has night sweats.  2.  Family history: -Father had liver cancer. -Brother had prostate cancer.      Orders placed this encounter:  No orders of the defined types were placed in this encounter.     Doreatha MassedSreedhar Katragadda, MD [facility] [phone]

## 2018-10-29 ENCOUNTER — Telehealth: Payer: Self-pay

## 2018-10-29 ENCOUNTER — Other Ambulatory Visit: Payer: Self-pay

## 2018-10-29 DIAGNOSIS — R599 Enlarged lymph nodes, unspecified: Secondary | ICD-10-CM

## 2018-10-29 DIAGNOSIS — R591 Generalized enlarged lymph nodes: Secondary | ICD-10-CM

## 2018-10-29 NOTE — Telephone Encounter (Signed)
-----   Message from Milus Banister, MD sent at 10/27/2018  7:08 AM EST ----- Dr. Delton Coombes, I looked at her PET, recent CT; Yes, I think sampling the periportal nodes via EUS is definitely feasible.  Can you let her know and we will contact her to schedule it.  Thanks. Adrienne Mocha, She needs upper eus, radial + Linear, MAC sedation. For periportal adenopathy. I can do this on Feb 20th after my current last case, should be a start time 12-12:30.  If Dr. Rush Landmark has earlier availability please offer that to her instead.  Thanks  Marlowe Aschoff, Juluis Rainier.   ----- Message ----- From: Derek Jack, MD Sent: 10/26/2018   1:58 PM EST To: Milus Banister, MD  Dr. Ardis Hughs, I am a medical oncologist at [place].  This is a 38 year old female who presented with fevers and night sweats.  She has diffuse adenopathy on the PET CT scan along with hypermetabolic splenomegaly.  We did a biopsy of the left axillary lymph node which has an SUV of 4.7, but nonconclusive.  The lymph nodes with the highest SUV are in the periportal region.  I would appreciate if you could take a look at her PET CT scan and let me know if it is feasible to biopsy these nodes by EUS.Thank you,Sree Delton Coombes

## 2018-10-29 NOTE — Telephone Encounter (Signed)
EUS scheduled 2/19 with Dr Meridee Score at [facility]. pt instructed and medications reviewed.  Instructions given to the pt via My Chart.  Patient to call with any questions or concerns.

## 2018-10-30 ENCOUNTER — Ambulatory Visit (INDEPENDENT_AMBULATORY_CARE_PROVIDER_SITE_OTHER): Payer: Self-pay | Admitting: General Surgery

## 2018-10-30 ENCOUNTER — Encounter: Payer: Self-pay | Admitting: General Surgery

## 2018-10-30 VITALS — BP 122/86 | HR 96 | Temp 98.4°F | Resp 18 | Wt 207.0 lb

## 2018-10-30 DIAGNOSIS — Z09 Encounter for follow-up examination after completed treatment for conditions other than malignant neoplasm: Secondary | ICD-10-CM

## 2018-10-30 NOTE — Progress Notes (Signed)
Subjective:     Crystal Mccoy  Here for surgery follow-up.  Has mild incisional pain which is much improved since her surgery. Objective:    BP 122/86 (BP Location: Left Arm, Patient Position: Sitting, Cuff Size: Large)   Pulse 96   Temp 98.4 F (36.9 C) (Temporal)   Resp 18   Wt 207 lb (93.9 kg)   LMP 10/17/2018 (Exact Date)   BMI 34.45 kg/m   General:  alert, cooperative and no distress  Left axillary incision healing well with minimal swelling. Final pathology negative.  This has been reviewed with the patient by oncology.     Assessment:    Doing well postoperatively.    Plan:   Increase activity with left arm as able.  Follow-up here as needed.

## 2018-11-01 ENCOUNTER — Ambulatory Visit (HOSPITAL_COMMUNITY): Payer: Self-pay | Admitting: Hematology

## 2018-11-06 ENCOUNTER — Encounter (HOSPITAL_COMMUNITY): Payer: Self-pay | Admitting: *Deleted

## 2018-11-06 ENCOUNTER — Other Ambulatory Visit: Payer: Self-pay

## 2018-11-06 ENCOUNTER — Ambulatory Visit (HOSPITAL_COMMUNITY): Payer: Self-pay | Admitting: Hematology

## 2018-11-06 NOTE — Progress Notes (Signed)
Mrs Villamar denies chest pain or shortness of breath.  Mrs Esaw Grandchild has type II diabetics,, she reports that CBG's run from 108 - fasting to 120.  I instructed patient to not take Victozia in am. I instructed patient to check CBG after awaking and every 2 hours until arrival  to the hospital.  I Instructed patient if  CBG is less than 70 to drink 1/2 cup of a clear juice or regular sprite. Recheck CBG in 15 minutes then call pre- op desk at [phone] for further instructions. If scheduled to receive Insulin, do not take Insulin

## 2018-11-07 ENCOUNTER — Other Ambulatory Visit: Payer: Self-pay

## 2018-11-07 ENCOUNTER — Ambulatory Visit (HOSPITAL_COMMUNITY)
Admission: RE | Admit: 2018-11-07 | Discharge: 2018-11-07 | Disposition: A | Discharge status: Home / Self Care | Payer: Managed Care, Other (non HMO) | Attending: Gastroenterology | Admitting: Gastroenterology

## 2018-11-07 ENCOUNTER — Ambulatory Visit (HOSPITAL_COMMUNITY): Payer: Managed Care, Other (non HMO) | Admitting: Certified Registered Nurse Anesthetist

## 2018-11-07 ENCOUNTER — Encounter (HOSPITAL_COMMUNITY): Payer: Self-pay | Admitting: Gastroenterology

## 2018-11-07 ENCOUNTER — Telehealth: Payer: Self-pay

## 2018-11-07 ENCOUNTER — Encounter (HOSPITAL_COMMUNITY)
Admission: RE | Disposition: A | Discharge status: Home / Self Care | Payer: Self-pay | Source: Home / Self Care | Attending: Gastroenterology

## 2018-11-07 DIAGNOSIS — R599 Enlarged lymph nodes, unspecified: Secondary | ICD-10-CM

## 2018-11-07 DIAGNOSIS — K2950 Unspecified chronic gastritis without bleeding: Secondary | ICD-10-CM | POA: Insufficient documentation

## 2018-11-07 DIAGNOSIS — E119 Type 2 diabetes mellitus without complications: Secondary | ICD-10-CM | POA: Diagnosis not present

## 2018-11-07 DIAGNOSIS — R591 Generalized enlarged lymph nodes: Secondary | ICD-10-CM | POA: Diagnosis not present

## 2018-11-07 DIAGNOSIS — K319 Disease of stomach and duodenum, unspecified: Secondary | ICD-10-CM | POA: Diagnosis not present

## 2018-11-07 DIAGNOSIS — R935 Abnormal findings on diagnostic imaging of other abdominal regions, including retroperitoneum: Secondary | ICD-10-CM | POA: Insufficient documentation

## 2018-11-07 DIAGNOSIS — Z538 Procedure and treatment not carried out for other reasons: Secondary | ICD-10-CM | POA: Diagnosis not present

## 2018-11-07 HISTORY — PX: BIOPSY: SHX5522

## 2018-11-07 HISTORY — PX: ESOPHAGOGASTRODUODENOSCOPY (EGD) WITH PROPOFOL: SHX5813

## 2018-11-07 LAB — GLUCOSE, CAPILLARY: Glucose-Capillary: 162 mg/dL — ABNORMAL HIGH (ref 70–99)

## 2018-11-07 SURGERY — ESOPHAGOGASTRODUODENOSCOPY (EGD) WITH PROPOFOL
Anesthesia: Monitor Anesthesia Care

## 2018-11-07 MED ORDER — LIDOCAINE HCL (CARDIAC) PF 100 MG/5ML IV SOSY
PREFILLED_SYRINGE | INTRAVENOUS | Status: DC | PRN
  Administered 2018-11-07: 60 mg via INTRAVENOUS

## 2018-11-07 MED ORDER — PROPOFOL 500 MG/50ML IV EMUL
INTRAVENOUS | Status: DC | PRN
  Administered 2018-11-07: 100 ug/kg/min via INTRAVENOUS

## 2018-11-07 MED ORDER — LACTATED RINGERS IV SOLN
INTRAVENOUS | Status: DC | PRN
  Administered 2018-11-07: 12:00:00 via INTRAVENOUS

## 2018-11-07 MED ORDER — PROPOFOL 10 MG/ML IV BOLUS
INTRAVENOUS | Status: DC | PRN
  Administered 2018-11-07: 40 mg via INTRAVENOUS
  Administered 2018-11-07: 100 mg via INTRAVENOUS

## 2018-11-07 MED ORDER — SODIUM CHLORIDE 0.9 % IV SOLN: INTRAVENOUS | Status: DC

## 2018-11-07 SURGICAL SUPPLY — 15 items

## 2018-11-07 NOTE — Anesthesia Postprocedure Evaluation (Signed)
Anesthesia Post Note  Patient: Crystal Mccoy  Procedure(s) Performed: ESOPHAGOGASTRODUODENOSCOPY (EGD) WITH PROPOFOL (N/A ) BIOPSY     Patient location during evaluation: Endoscopy Anesthesia Type: MAC Level of consciousness: awake Pain management: pain level controlled Vital Signs Assessment: post-procedure vital signs reviewed and stable Respiratory status: spontaneous breathing Cardiovascular status: stable Postop Assessment: no apparent nausea or vomiting Anesthetic complications: no    Last Vitals:  Vitals:   11/07/18 1300 11/07/18 1310  BP: (!) 149/71 133/71  Pulse: 96 85  Resp: 15 17  Temp:    SpO2: 99% 99%    Last Pain:  Vitals:   11/07/18 1310  TempSrc:   PainSc: 0-No pain                 Zakarie Sturdivant

## 2018-11-07 NOTE — Anesthesia Preprocedure Evaluation (Signed)
Anesthesia Evaluation  Patient identified by MRN, date of birth, ID band Patient awake    Reviewed: Allergy & Precautions, NPO status , Patient's Chart, lab work & pertinent test results  Airway Mallampati: II  TM Distance: >3 FB     Dental   Pulmonary neg pulmonary ROS,    breath sounds clear to auscultation       Cardiovascular  Rhythm:Regular Rate:Normal     Neuro/Psych    GI/Hepatic Neg liver ROS, History noted CG   Endo/Other  diabetes  Renal/GU negative Renal ROS     Musculoskeletal   Abdominal   Peds  Hematology   Anesthesia Other Findings   Reproductive/Obstetrics                             Anesthesia Physical Anesthesia Plan  ASA: III  Anesthesia Plan: MAC   Post-op Pain Management:    Induction: Intravenous  PONV Risk Score and Plan: Propofol infusion  Airway Management Planned: Nasal Cannula and Simple Face Mask  Additional Equipment:   Intra-op Plan:   Post-operative Plan:   Informed Consent: I have reviewed the patients History and Physical, chart, labs and discussed the procedure including the risks, benefits and alternatives for the proposed anesthesia with the patient or authorized representative who has indicated his/her understanding and acceptance.     Dental advisory given  Plan Discussed with: CRNA and Anesthesiologist  Anesthesia Plan Comments:         Anesthesia Quick Evaluation

## 2018-11-07 NOTE — Discharge Instructions (Signed)
YOU HAD AN ENDOSCOPIC PROCEDURE TODAY: Refer to the procedure report and other information in the discharge instructions given to you for any specific questions about what was found during the examination. If this information does not answer your questions, please call [facility] office at [phone] to clarify.  ° °YOU SHOULD EXPECT: Some feelings of bloating in the abdomen. Passage of more gas than usual. Walking can help get rid of the air that was put into your GI tract during the procedure and reduce the bloating. If you had a lower endoscopy (such as a colonoscopy or flexible sigmoidoscopy) you may notice spotting of blood in your stool or on the toilet paper. Some abdominal soreness may be present for a day or two, also. ° °DIET: Your first meal following the procedure should be a light meal and then it is ok to progress to your normal diet. A half-sandwich or bowl of soup is an example of a good first meal. Heavy or fried foods are harder to digest and may make you feel nauseous or bloated. Drink plenty of fluids but you should avoid alcoholic beverages for 24 hours. If you had a esophageal dilation, please see attached instructions for diet.   ° °ACTIVITY: Your care partner should take you home directly after the procedure. You should plan to take it easy, moving slowly for the rest of the day. You can resume normal activity the day after the procedure however YOU SHOULD NOT DRIVE, use power tools, machinery or perform tasks that involve climbing or major physical exertion for 24 hours (because of the sedation medicines used during the test).  ° °SYMPTOMS TO REPORT IMMEDIATELY: °A gastroenterologist can be reached at any hour. Please call [phone]  for any of the following symptoms:  °Following lower endoscopy (colonoscopy, flexible sigmoidoscopy) °Excessive amounts of blood in the stool  °Significant tenderness, worsening of abdominal pains  °Swelling of the abdomen that is new, acute  °Fever of 100° or  higher  °Following upper endoscopy (EGD, EUS, ERCP, esophageal dilation) °Vomiting of blood or coffee ground material  °New, significant abdominal pain  °New, significant chest pain or pain under the shoulder blades  °Painful or persistently difficult swallowing  °New shortness of breath  °Black, tarry-looking or red, bloody stools ° °FOLLOW UP:  °If any biopsies were taken you will be contacted by phone or by letter within the next 1-3 weeks. Call [phone]  if you have not heard about the biopsies in 3 weeks.  °Please also call with any specific questions about appointments or follow up tests. ° °

## 2018-11-07 NOTE — Op Note (Addendum)
[facility] Patient Name: Crystal Mccoy Procedure Date : 11/07/2018 MRN: [id number] Attending MD: Justice Britain , MD Date of Birth: 03/01/1981 CSN: [id number] Age: 38 Admit Type: Outpatient Procedure:                Upper EUS Indications:              Lymphadenopathy on CT scan, Abnormal abdominal PET                            scan Providers:                Justice Britain, MD, Burtis Junes, RN, Charolette Child, Technician, Cira Servant, CRNA Referring MD:             Derek Jack, MD Medicines:                Monitored Anesthesia Care Complications:            No immediate complications. Estimated Blood Loss:     Estimated blood loss was minimal. Procedure:                Pre-Anesthesia Assessment:                           - Prior to the procedure, a History and Physical                            was performed, and patient medications and                            allergies were reviewed. The patient's tolerance of                            previous anesthesia was also reviewed. The risks                            and benefits of the procedure and the sedation                            options and risks were discussed with the patient.                            All questions were answered, and informed consent                            was obtained. Prior Anticoagulants: The patient has                            taken no previous anticoagulant or antiplatelet                            agents. ASA Grade Assessment: II - A patient with  mild systemic disease. After reviewing the risks                            and benefits, the patient was deemed in                            satisfactory condition to undergo the procedure.                           After obtaining informed consent, the endoscope was                            passed under direct vision. Throughout the                             procedure, the patient's blood pressure, pulse, and                            oxygen saturations were monitored continuously. The                            GIF-H190 ([id number]) Olympus gastroscope was                            introduced through the mouth, with the intention of                            advancing to the duodenum. The scope was advanced                            to the second part of the duodenum before the                            procedure was aborted. Medications were given.                            After obtaining informed consent, the endoscope was                            passed under direct vision. Throughout the                            procedure, the patient's blood pressure, pulse, and                            oxygen saturations were monitored continuously.The                            upper EUS was aborted due to presence of food. The                            upper GI endoscopy was accomplished without  difficulty. The patient tolerated the procedure. Scope In: Scope Out: Findings:      ENDOSCOPIC FINDING: :      No gross lesions were noted in the entire esophagus.      The Z-line was regular and was found 35 cm from the incisors.      A large amount of food (residue) was found in the entire examined       stomach. Suction via endoscope was not successful. Lavage of the area       was performed using a small amount, resulting in incomplete clearance       with continued poor visualization.      Food (residue) was found in the duodenal bulb, in the first portion of       the duodenum and in the second portion of the duodenum. Lavage of the       area was performed using a moderate amount, resulting in incomplete       clearance with continued poor visualization.      Biopsies were taken with a cold forceps in the gastric body and in the       gastric antrum for histology and for H. pylori identification.      Biopsies  were taken with a cold forceps in the duodenal bulb, in the       first portion of the duodenum and in the second portion of the duodenum       for histology and rule out Celiac/Enteropathy.      As a result of the significant foodstuffs, safe sampling through the       gastric or the duodenal wall was not felt possible. EUS was not       performed. Impression:               - The upper EUS portion of procedure was aborted                            due to presence of food.                           - No gross lesions in esophagus.                           - Z-line regular, 35 cm from the incisors.                           - A large amount of food (residue) in the stomach.                           - Retained food in the duodenum.                           - Biopsies were taken with a cold forceps for                            histology in the gastric body and in the gastric                            antrum for HP.                           -  Biopsies were taken with a cold forceps for                            histology in the duodenal bulb, in the first                            portion of the duodenum and in the second portion                            of the duodenum to rule out celiac/enteropathy. Recommendation:           - The patient will be observed post-procedure,                            until all discharge criteria are met.                           - Discharge patient to home.                           - Patient has a contact number available for                            emergencies. The signs and symptoms of potential                            delayed complications were discussed with the                            patient. Return to normal activities tomorrow.                            Written discharge instructions were provided to the                            patient.                           - Resume regular diet.                           - Observe  patient's clinical course.                           - Await path results. Treat H. pylori if positive.                           - Repeat the upper endoscopic ultrasound at next                            available with myself or Dr. Ardis Hughs. For the repeat                            attempt, she should be on full liquids the entire  day before and NPO after midnight. Unclear why she                            would have retained fluids though has a history of                            Diabetes, so gastroparesis should be considered in                            future. She did briefly describe an early satiety                            that has been a symptoms over the last few weeks of                            her illness.                           - The findings and recommendations were discussed                            with the patient.                           - The findings and recommendations were discussed                            with the patient's family. Procedure Code(s):        --- Professional ---                           [phone], 73, Esophagogastroduodenoscopy, flexible,                            transoral; with biopsy, single or multiple Diagnosis Code(s):        --- Professional ---                           Z53.8, Procedure and treatment not carried out for                            other reasons                           R59.1, Generalized enlarged lymph nodes                           R93.5, Abnormal findings on diagnostic imaging of                            other abdominal regions, including retroperitoneum CPT copyright 2018 [employer]. All rights reserved. The codes documented in this report are preliminary and upon coder review may  be revised to meet current compliance requirements. Justice Britain, MD 11/07/2018 12:52:23 PM Number of Addenda: 0

## 2018-11-07 NOTE — Telephone Encounter (Signed)
-----   Message from Lemar Lofty., MD sent at 11/07/2018 12:56 PM EST ----- Dr. Ellin Saba, I hope you are well. Your patient came for her EUS with attempt at biopsy of the LNs noted on PET-CT. Unfortunately, she had a significant amount of foodstuffs in the stomach and duodenum. We don't try to sample through the mucosa when there is significant foodstuffs. I took quick gastric and duodenal biopsies and aborted the procedure. Yahya Boldman, can you look at Dan's schedule in the next week, if there is no availability for him, then she can be put on my hospital week, next week as a repeat attempt at EUS and possible biopsy. She will need to be on liquids the entire day prior to ensure our potential success next go around. Thanks. Jomarie Longs. Liz Beach

## 2018-11-07 NOTE — Transfer of Care (Signed)
Immediate Anesthesia Transfer of Care Note  Patient: Crystal Mccoy  Procedure(s) Performed: ESOPHAGOGASTRODUODENOSCOPY (EGD) WITH PROPOFOL (N/A ) BIOPSY  Patient Location: PACU and Endoscopy Unit  Anesthesia Type:MAC  Level of Consciousness: awake, alert , oriented and patient cooperative  Airway & Oxygen Therapy: Patient Spontanous Breathing and Patient connected to nasal cannula oxygen  Post-op Assessment: Report given to RN and Post -op Vital signs reviewed and stable  Post vital signs: Reviewed and stable  Last Vitals:  Vitals Value Taken Time  BP    Temp    Pulse 114 11/07/2018 12:47 PM  Resp 17 11/07/2018 12:47 PM  SpO2 99 % 11/07/2018 12:47 PM  Vitals shown include unvalidated device data.  Last Pain:  Vitals:   11/07/18 1059  TempSrc: Oral  PainSc: 0-No pain         Complications: No apparent anesthesia complications

## 2018-11-07 NOTE — H&P (View-Only) (Signed)
GASTROENTEROLOGY OUTPATIENT PROCEDURE H&P NOTE   Primary Care Physician: Patient, No Pcp Per  HPI: Crystal Mccoy is a 38 y.o. female who presents for EUS.  Past Medical History:  Diagnosis Date  . Anxiety   . Depression   . Diabetes mellitus without complication (HCC)    Type II   Past Surgical History:  Procedure Laterality Date  . AXILLARY LYMPH NODE BIOPSY Left 10/22/2018   Procedure: AXILLARY LYMPH NODE BIOPSY;  Surgeon: Franky Macho, MD;  Location: [facility];  Service: General;  Laterality: Left;  . CESAREAN SECTION    . TONSILLECTOMY     No current facility-administered medications for this encounter.    Allergies  Allergen Reactions  . Percocet [Oxycodone-Acetaminophen] Other (See Comments)    When taking it for a few days in a row  . Omniflox [Temafloxacin]   . Latex Rash    Only when on face  . Penicillins Rash    Has patient had a PCN reaction causing immediate rash, facial/tongue/throat swelling, SOB or lightheadedness with hypotension: Yes Has patient had a PCN reaction causing severe rash involving mucus membranes or skin necrosis: No Has patient had a PCN reaction that required hospitalization No Has patient had a PCN reaction occurring within the last 10 years: No If all of the above answers are "NO", then may proceed with Cephalosporin use.    Family History  Problem Relation Age of Onset  . Hypertension Father   . Stroke Father   . Cancer - Other Father   . Seizures Father   . Diabetes Father   . Heart attack Father   . Cancer Brother   . Heart attack Paternal Uncle   . Heart attack Paternal Grandfather    Social History   Socioeconomic History  . Marital status: Married    Spouse name: Not on file  . Number of children: Not on file  . Years of education: Not on file  . Highest education level: Not on file  Occupational History  . Not on file  Social Needs  . Financial resource strain: Not on file  . Food insecurity:    Worry: Not on  file    Inability: Not on file  . Transportation needs:    Medical: Not on file    Non-medical: Not on file  Tobacco Use  . Smoking status: Never Smoker  . Smokeless tobacco: Never Used  Substance and Sexual Activity  . Alcohol use: Not Currently  . Drug use: No  . Sexual activity: Yes  Lifestyle  . Physical activity:    Days per week: Not on file    Minutes per session: Not on file  . Stress: Not on file  Relationships  . Social connections:    Talks on phone: Not on file    Gets together: Not on file    Attends religious service: Not on file    Active member of club or organization: Not on file    Attends meetings of clubs or organizations: Not on file    Relationship status: Not on file  . Intimate partner violence:    Fear of current or ex partner: Not on file    Emotionally abused: Not on file    Physically abused: Not on file    Forced sexual activity: Not on file  Other Topics Concern  . Not on file  Social History Narrative  . Not on file    Physical Exam: Vital signs in last 24 hours:  GEN: NAD EYE: Sclerae anicteric ENT: MMM CV: RR without R/Gs  RESP: CTAB posteriorly GI: Soft, NT/ND NEURO:  Alert & Oriented x 3  Lab Results: No results for input(s): WBC, HGB, HCT, PLT in the last 72 hours. BMET No results for input(s): NA, K, CL, CO2, GLUCOSE, BUN, CREATININE, CALCIUM in the last 72 hours. LFT No results for input(s): PROT, ALBUMIN, AST, ALT, ALKPHOS, BILITOT, BILIDIR, IBILI in the last 72 hours. PT/INR No results for input(s): LABPROT, INR in the last 72 hours.   Impression / Plan: This is a 38 y.o.female who presents for EUS.  The risks of EUS including bleeding, infection, aspiration pneumonia and intestinal perforation were discussed as was the possibility it may not give a definitive diagnosis.  If a biopsy of the pancreas is done as part of the EUS, there is an additional risk of pancreatitis at the rate of about 1%.  It was explained  that procedure related pancreatitis is typically mild, although can be severe and even life threatening, which is why we do not perform random pancreatic biopsies and only biopsy a lesion we feel is concerning enough to warrant the risk.  The risks and benefits of endoscopic evaluation were discussed with the patient; these include but are not limited to the risk of perforation, infection, bleeding, missed lesions, lack of diagnosis, severe illness requiring hospitalization, as well as anesthesia and sedation related illnesses.  The patient is agreeable to proceed.    Crystal Parish, MD [facility] Office # [phone]

## 2018-11-07 NOTE — H&P (Signed)
GASTROENTEROLOGY OUTPATIENT PROCEDURE H&P NOTE   Primary Care Physician: Patient, No Pcp Per  HPI: Crystal Mccoy is a 38 y.o. female who presents for EUS.  Past Medical History:  Diagnosis Date  . Anxiety   . Depression   . Diabetes mellitus without complication (HCC)    Type II   Past Surgical History:  Procedure Laterality Date  . AXILLARY LYMPH NODE BIOPSY Left 10/22/2018   Procedure: AXILLARY LYMPH NODE BIOPSY;  Surgeon: Franky Macho, MD;  Location: [facility];  Service: General;  Laterality: Left;  . CESAREAN SECTION    . TONSILLECTOMY     No current facility-administered medications for this encounter.    Allergies  Allergen Reactions  . Percocet [Oxycodone-Acetaminophen] Other (See Comments)    When taking it for a few days in a row  . Omniflox [Temafloxacin]   . Latex Rash    Only when on face  . Penicillins Rash    Has patient had a PCN reaction causing immediate rash, facial/tongue/throat swelling, SOB or lightheadedness with hypotension: Yes Has patient had a PCN reaction causing severe rash involving mucus membranes or skin necrosis: No Has patient had a PCN reaction that required hospitalization No Has patient had a PCN reaction occurring within the last 10 years: No If all of the above answers are "NO", then may proceed with Cephalosporin use.    Family History  Problem Relation Age of Onset  . Hypertension Father   . Stroke Father   . Cancer - Other Father   . Seizures Father   . Diabetes Father   . Heart attack Father   . Cancer Brother   . Heart attack Paternal Uncle   . Heart attack Paternal Grandfather    Social History   Socioeconomic History  . Marital status: Married    Spouse name: Not on file  . Number of children: Not on file  . Years of education: Not on file  . Highest education level: Not on file  Occupational History  . Not on file  Social Needs  . Financial resource strain: Not on file  . Food insecurity:    Worry: Not on  file    Inability: Not on file  . Transportation needs:    Medical: Not on file    Non-medical: Not on file  Tobacco Use  . Smoking status: Never Smoker  . Smokeless tobacco: Never Used  Substance and Sexual Activity  . Alcohol use: Not Currently  . Drug use: No  . Sexual activity: Yes  Lifestyle  . Physical activity:    Days per week: Not on file    Minutes per session: Not on file  . Stress: Not on file  Relationships  . Social connections:    Talks on phone: Not on file    Gets together: Not on file    Attends religious service: Not on file    Active member of club or organization: Not on file    Attends meetings of clubs or organizations: Not on file    Relationship status: Not on file  . Intimate partner violence:    Fear of current or ex partner: Not on file    Emotionally abused: Not on file    Physically abused: Not on file    Forced sexual activity: Not on file  Other Topics Concern  . Not on file  Social History Narrative  . Not on file    Physical Exam: Vital signs in last 24 hours:  GEN: NAD EYE: Sclerae anicteric ENT: MMM CV: RR without R/Gs  RESP: CTAB posteriorly GI: Soft, NT/ND NEURO:  Alert & Oriented x 3  Lab Results: No results for input(s): WBC, HGB, HCT, PLT in the last 72 hours. BMET No results for input(s): NA, K, CL, CO2, GLUCOSE, BUN, CREATININE, CALCIUM in the last 72 hours. LFT No results for input(s): PROT, ALBUMIN, AST, ALT, ALKPHOS, BILITOT, BILIDIR, IBILI in the last 72 hours. PT/INR No results for input(s): LABPROT, INR in the last 72 hours.   Impression / Plan: This is a 37 y.o.female who presents for EUS.  The risks of EUS including bleeding, infection, aspiration pneumonia and intestinal perforation were discussed as was the possibility it may not give a definitive diagnosis.  If a biopsy of the pancreas is done as part of the EUS, there is an additional risk of pancreatitis at the rate of about 1%.  It was explained  that procedure related pancreatitis is typically mild, although can be severe and even life threatening, which is why we do not perform random pancreatic biopsies and only biopsy a lesion we feel is concerning enough to warrant the risk.  The risks and benefits of endoscopic evaluation were discussed with the patient; these include but are not limited to the risk of perforation, infection, bleeding, missed lesions, lack of diagnosis, severe illness requiring hospitalization, as well as anesthesia and sedation related illnesses.  The patient is agreeable to proceed.    Gabriel Mansouraty, MD [facility] Office # [phone]  

## 2018-11-07 NOTE — Telephone Encounter (Signed)
Rachael Fee, MD  Mansouraty, Netty Starring., MD; Loretha Stapler, RN         I do not have time next Thursday, the 27th. I do however have openings on Thursday, March 5. Please offer her 1 of those March 5 spots. Agree with Gabe's recommendations about dietary, liquid restrictions.

## 2018-11-08 ENCOUNTER — Encounter (HOSPITAL_COMMUNITY): Payer: Self-pay

## 2018-11-08 ENCOUNTER — Other Ambulatory Visit: Payer: Self-pay

## 2018-11-08 ENCOUNTER — Encounter: Payer: Self-pay | Admitting: Gastroenterology

## 2018-11-08 DIAGNOSIS — R599 Enlarged lymph nodes, unspecified: Secondary | ICD-10-CM

## 2018-11-08 DIAGNOSIS — R591 Generalized enlarged lymph nodes: Secondary | ICD-10-CM

## 2018-11-08 MED ORDER — OMEPRAZOLE 40 MG PO CPDR: 40.0000 mg | DELAYED_RELEASE_CAPSULE | Freq: Every day | ORAL | 2 refills | Status: DC

## 2018-11-08 NOTE — Telephone Encounter (Signed)
11/22/18 EUS scheduled Voice mail full.  Will send message via My Chart.

## 2018-11-08 NOTE — Telephone Encounter (Signed)
Mansouraty, Netty Starring., MD  Rachael Fee, MD; Loretha Stapler, RN        Thank you Jesusita Oka.  Please place for the March 5 spot.  Liz Beach

## 2018-11-09 ENCOUNTER — Ambulatory Visit (HOSPITAL_COMMUNITY): Payer: Self-pay | Admitting: Hematology

## 2018-11-14 ENCOUNTER — Ambulatory Visit (HOSPITAL_COMMUNITY): Payer: Self-pay | Admitting: Hematology

## 2018-11-22 ENCOUNTER — Ambulatory Visit (HOSPITAL_COMMUNITY): Payer: Managed Care, Other (non HMO) | Admitting: Anesthesiology

## 2018-11-22 ENCOUNTER — Encounter (HOSPITAL_COMMUNITY)
Admission: RE | Disposition: A | Discharge status: Home / Self Care | Payer: Self-pay | Source: Home / Self Care | Attending: Gastroenterology

## 2018-11-22 ENCOUNTER — Ambulatory Visit (HOSPITAL_COMMUNITY)
Admission: RE | Admit: 2018-11-22 | Discharge: 2018-11-22 | Disposition: A | Discharge status: Home / Self Care | Payer: Managed Care, Other (non HMO) | Attending: Gastroenterology | Admitting: Gastroenterology

## 2018-11-22 ENCOUNTER — Encounter (HOSPITAL_COMMUNITY): Payer: Self-pay | Admitting: Anesthesiology

## 2018-11-22 ENCOUNTER — Other Ambulatory Visit: Payer: Self-pay

## 2018-11-22 DIAGNOSIS — E119 Type 2 diabetes mellitus without complications: Secondary | ICD-10-CM | POA: Insufficient documentation

## 2018-11-22 DIAGNOSIS — Z7984 Long term (current) use of oral hypoglycemic drugs: Secondary | ICD-10-CM | POA: Insufficient documentation

## 2018-11-22 DIAGNOSIS — R590 Localized enlarged lymph nodes: Secondary | ICD-10-CM | POA: Insufficient documentation

## 2018-11-22 DIAGNOSIS — Z885 Allergy status to narcotic agent status: Secondary | ICD-10-CM | POA: Diagnosis not present

## 2018-11-22 DIAGNOSIS — R591 Generalized enlarged lymph nodes: Secondary | ICD-10-CM | POA: Diagnosis not present

## 2018-11-22 DIAGNOSIS — F329 Major depressive disorder, single episode, unspecified: Secondary | ICD-10-CM | POA: Diagnosis not present

## 2018-11-22 DIAGNOSIS — Z880 Allergy status to penicillin: Secondary | ICD-10-CM | POA: Insufficient documentation

## 2018-11-22 DIAGNOSIS — Z79899 Other long term (current) drug therapy: Secondary | ICD-10-CM | POA: Diagnosis not present

## 2018-11-22 DIAGNOSIS — Z881 Allergy status to other antibiotic agents status: Secondary | ICD-10-CM | POA: Insufficient documentation

## 2018-11-22 DIAGNOSIS — R599 Enlarged lymph nodes, unspecified: Secondary | ICD-10-CM

## 2018-11-22 HISTORY — PX: EUS: SHX5427

## 2018-11-22 HISTORY — PX: FINE NEEDLE ASPIRATION: SHX5430

## 2018-11-22 HISTORY — PX: ESOPHAGOGASTRODUODENOSCOPY (EGD) WITH PROPOFOL: SHX5813

## 2018-11-22 SURGERY — UPPER ENDOSCOPIC ULTRASOUND (EUS) RADIAL
Anesthesia: Monitor Anesthesia Care

## 2018-11-22 MED ORDER — PROPOFOL 10 MG/ML IV BOLUS
INTRAVENOUS | Status: AC
  Filled 2018-11-22: qty 40

## 2018-11-22 MED ORDER — PROPOFOL 10 MG/ML IV BOLUS
INTRAVENOUS | Status: AC
  Filled 2018-11-22: qty 20

## 2018-11-22 MED ORDER — PROPOFOL 10 MG/ML IV BOLUS
INTRAVENOUS | Status: DC | PRN
  Administered 2018-11-22 (×2): 25 mg via INTRAVENOUS

## 2018-11-22 MED ORDER — PROPOFOL 500 MG/50ML IV EMUL
INTRAVENOUS | Status: DC | PRN
  Administered 2018-11-22: 150 ug/kg/min via INTRAVENOUS

## 2018-11-22 MED ORDER — LACTATED RINGERS IV SOLN
INTRAVENOUS | Status: DC
  Administered 2018-11-22: 09:00:00 via INTRAVENOUS

## 2018-11-22 MED ORDER — SODIUM CHLORIDE 0.9 % IV SOLN: INTRAVENOUS | Status: DC

## 2018-11-22 MED ORDER — LIDOCAINE 2% (20 MG/ML) 5 ML SYRINGE
INTRAMUSCULAR | Status: DC | PRN
  Administered 2018-11-22: 100 mg via INTRAVENOUS

## 2018-11-22 NOTE — Anesthesia Postprocedure Evaluation (Signed)
Anesthesia Post Note  Patient: Crystal Mccoy  Procedure(s) Performed: UPPER ENDOSCOPIC ULTRASOUND (EUS) RADIAL (N/A ) FINE NEEDLE ASPIRATION (FNA) LINEAR (N/A )     Patient location during evaluation: PACU Anesthesia Type: MAC Level of consciousness: awake and alert and oriented Pain management: pain level controlled Vital Signs Assessment: post-procedure vital signs reviewed and stable Respiratory status: spontaneous breathing, nonlabored ventilation and respiratory function stable Cardiovascular status: stable and blood pressure returned to baseline Postop Assessment: no apparent nausea or vomiting Anesthetic complications: no    Last Vitals:  Vitals:   11/22/18 1136 11/22/18 1140  BP: 127/83 117/77  Pulse: 100 91  Resp: 16 13  Temp: 36.4 C   SpO2: 100% 100%    Last Pain:  Vitals:   11/22/18 1136  TempSrc: Oral  PainSc: 0-No pain                 Tkeya Stencil A.

## 2018-11-22 NOTE — Discharge Instructions (Signed)
YOU HAD AN ENDOSCOPIC PROCEDURE TODAY: Refer to the procedure report and other information in the discharge instructions given to you for any specific questions about what was found during the examination. If this information does not answer your questions, please call [place] office at [phone] to clarify.   YOU SHOULD EXPECT: Some feelings of bloating in the abdomen. Passage of more gas than usual. Walking can help get rid of the air that was put into your GI tract during the procedure and reduce the bloating. If you had a lower endoscopy (such as a colonoscopy or flexible sigmoidoscopy) you may notice spotting of blood in your stool or on the toilet paper. Some abdominal soreness may be present for a day or two, also.  DIET: Your first meal following the procedure should be a light meal and then it is ok to progress to your normal diet. A half-sandwich or bowl of soup is an example of a good first meal. Heavy or fried foods are harder to digest and may make you feel nauseous or bloated. Drink plenty of fluids but you should avoid alcoholic beverages for 24 hours. If you had a esophageal dilation, please see attached instructions for diet.    ACTIVITY: Your care partner should take you home directly after the procedure. You should plan to take it easy, moving slowly for the rest of the day. You can resume normal activity the day after the procedure however YOU SHOULD NOT DRIVE, use power tools, machinery or perform tasks that involve climbing or major physical exertion for 24 hours (because of the sedation medicines used during the test).   SYMPTOMS TO REPORT IMMEDIATELY: A gastroenterologist can be reached at any hour. Please call [phone]  for any of the following symptoms:   Following upper endoscopy (EGD, EUS, ERCP, esophageal dilation) Vomiting of blood or coffee ground material  New, significant abdominal pain  New, significant chest pain or pain under the shoulder blades  Painful or  persistently difficult swallowing  New shortness of breath  Black, tarry-looking or red, bloody stools  FOLLOW UP:  If any biopsies were taken you will be contacted by phone or by letter within the next 1-3 weeks. Call [phone]  if you have not heard about the biopsies in 3 weeks.  Please also call with any specific questions about appointments or follow up tests.  

## 2018-11-22 NOTE — Anesthesia Preprocedure Evaluation (Signed)
Anesthesia Evaluation  Patient identified by MRN, date of birth, ID band Patient awake    Reviewed: Allergy & Precautions, NPO status , Patient's Chart, lab work & pertinent test results  Airway Mallampati: II  TM Distance: >3 FB Neck ROM: Full    Dental no notable dental hx. (+) Teeth Intact   Pulmonary neg pulmonary ROS,    Pulmonary exam normal breath sounds clear to auscultation       Cardiovascular negative cardio ROS Normal cardiovascular exam Rhythm:Regular Rate:Normal     Neuro/Psych PSYCHIATRIC DISORDERS Anxiety Depression negative neurological ROS     GI/Hepatic negative GI ROS, Neg liver ROS, Abnormal PET scan - periportal adenopathy   Endo/Other  diabetes, Well Controlled, Type 2, Oral Hypoglycemic Agents  Renal/GU negative Renal ROS  negative genitourinary   Musculoskeletal negative musculoskeletal ROS (+)   Abdominal   Peds  Hematology negative hematology ROS (+)   Anesthesia Other Findings   Reproductive/Obstetrics                             Anesthesia Physical Anesthesia Plan  ASA: II  Anesthesia Plan: MAC   Post-op Pain Management:    Induction: Intravenous  PONV Risk Score and Plan: 2 and Ondansetron, Propofol infusion and Treatment may vary due to age or medical condition  Airway Management Planned: Natural Airway and Nasal Cannula  Additional Equipment:   Intra-op Plan:   Post-operative Plan:   Informed Consent: I have reviewed the patients History and Physical, chart, labs and discussed the procedure including the risks, benefits and alternatives for the proposed anesthesia with the patient or authorized representative who has indicated his/her understanding and acceptance.     Dental advisory given  Plan Discussed with: CRNA and Surgeon  Anesthesia Plan Comments:         Anesthesia Quick Evaluation

## 2018-11-22 NOTE — Transfer of Care (Signed)
Immediate Anesthesia Transfer of Care Note  Patient: Crystal Mccoy  Procedure(s) Performed: UPPER ENDOSCOPIC ULTRASOUND (EUS) RADIAL (N/A ) FINE NEEDLE ASPIRATION (FNA) LINEAR (N/A )  Patient Location: PACU  Anesthesia Type:MAC  Level of Consciousness: sedated  Airway & Oxygen Therapy: Patient Spontanous Breathing and Patient connected to nasal cannula oxygen  Post-op Assessment: Report given to RN and Post -op Vital signs reviewed and stable  Post vital signs: Reviewed and stable  Last Vitals:  Vitals Value Taken Time  BP    Temp    Pulse    Resp    SpO2      Last Pain:  Vitals:   11/22/18 0917  TempSrc: Oral  PainSc: 0-No pain         Complications: No apparent anesthesia complications

## 2018-11-22 NOTE — Op Note (Signed)
[facility] Patient Name: Crystal HarderDebra Mccoy Procedure Date: 11/22/2018 MRN: [id number] Attending MD: Rachael Feeaniel P Lilyann Gravelle , MD Date of Birth: 03/08/1981 CSN: [id number] Age: 38 Admit Type: Outpatient Procedure:                Upper EUS Indications:              PET avid periportal adenopathy; recent left                            axillary LN biopsy was non-diagnostic Providers:                Rachael Feeaniel P. Jedediah Noda, MD, Dwain SarnaPatricia Ford, RN, Zoila ShutterGary                            Bryant, Technician, [facility] Dee Williford, CRNA Referring MD:             Doreatha MassedKatragadda, Sreedhar, MD Medicines:                Monitored Anesthesia Care Complications:            No immediate complications. Estimated blood loss:                            None. Estimated Blood Loss:     Estimated blood loss: none. Procedure:                Pre-Anesthesia Assessment:                           - Prior to the procedure, a History and Physical                            was performed, and patient medications and                            allergies were reviewed. The patient's tolerance of                            previous anesthesia was also reviewed. The risks                            and benefits of the procedure and the sedation                            options and risks were discussed with the patient.                            All questions were answered, and informed consent                            was obtained. Prior Anticoagulants: The patient has                            taken no previous anticoagulant or antiplatelet  agents. ASA Grade Assessment: II - A patient with                            mild systemic disease. After reviewing the risks                            and benefits, the patient was deemed in                            satisfactory condition to undergo the procedure.                           After obtaining informed consent, the endoscope was         passed under direct vision. Throughout the                            procedure, the patient's blood pressure, pulse, and                            oxygen saturations were monitored continuously. The                            [id number]-UE160-[id number] ([id number]) Olympus Radial EUS was                            introduced through the mouth, and advanced to the                            second part of duodenum. The [id number] ([id number])                            Olympus Linear EUS was introduced through the                            mouth, and advanced to the second part of duodenum.                            The upper EUS was accomplished without difficulty.                            The patient tolerated the procedure well. Scope In: Scope Out: Findings:      ENDOSCOPIC FINDING: :      The examined esophagus was endoscopically normal.      The entire examined stomach was endoscopically normal.      The examined duodenum was endoscopically normal.      ENDOSONOGRAPHIC FINDING: :      1. One enlarged lymph node was visualized in the porta hepatis region.       It measured 28 mm by 14 mm in maximal cross-sectional diameter. This may       represent a chain of several lymphnodes. The nodal complex was oval,       hypoechoic and heterogenous and had well defined margins. Fine needle       aspiration for cytology was performed. Color Doppler  imaging was       utilized prior to needle puncture to confirm a lack of significant       vascular structures within the needle path. Two passes were made with       the 22 gauge needle using a transduodenal approach. Final cytology       results are pending.      2. Limited views of liver, pancreas, CBD, gallbladder were all normal. Impression:               - PET avid periportal adenopathy, sampled with EUS                            FNA. Moderate Sedation:      Not Applicable - Patient had care per Anesthesia. Recommendation:           - Discharge  patient to home (ambulatory).                           - Await cytology results. Procedure Code(s):        --- Professional ---                           [phone], Esophagogastroduodenoscopy, flexible,                            transoral; with transendoscopic ultrasound-guided                            intramural or transmural fine needle                            aspiration/biopsy(s), (includes endoscopic                            ultrasound examination limited to the esophagus,                            stomach or duodenum, and adjacent structures) Diagnosis Code(s):        --- Professional ---                           R59.0, Localized enlarged lymph nodes                           R59.1, Generalized enlarged lymph nodes CPT copyright 2018 [employer] Association. All rights reserved. The codes documented in this report are preliminary and upon coder review may  be revised to meet current compliance requirements. Rachael Fee, MD 11/22/2018 11:46:30 AM This report has been signed electronically. Number of Addenda: 0

## 2018-11-22 NOTE — Interval H&P Note (Signed)
History and Physical Interval Note:  11/22/2018 9:58 AM  Crystal Mccoy  has presented today for surgery, with the diagnosis of abnormal PET- periportal adenopathy  The various methods of treatment have been discussed with the patient and family. After consideration of risks, benefits and other options for treatment, the patient has consented to  Procedure(s): UPPER ENDOSCOPIC ULTRASOUND (EUS) RADIAL (N/A) as a surgical intervention .  The patient's history has been reviewed, patient examined, no change in status, stable for surgery.  I have reviewed the patient's chart and labs.  Questions were answered to the patient's satisfaction.     Rachael Fee

## 2018-11-23 ENCOUNTER — Encounter (HOSPITAL_COMMUNITY): Payer: Self-pay | Admitting: Gastroenterology

## 2018-11-28 ENCOUNTER — Telehealth: Payer: Self-pay | Admitting: Gastroenterology

## 2018-11-28 NOTE — Telephone Encounter (Signed)
I just tried again to reach her.  No answer on her cell, I left another VM

## 2018-11-28 NOTE — Telephone Encounter (Signed)
Pt was returning Dr.Jacobs called to discuss results.

## 2018-11-28 NOTE — Telephone Encounter (Signed)
Dr Christella Hartigan the pt is returning your call.  Please call her at your convenience.

## 2018-12-03 ENCOUNTER — Other Ambulatory Visit: Payer: Self-pay

## 2018-12-03 ENCOUNTER — Inpatient Hospital Stay (HOSPITAL_COMMUNITY): Payer: Managed Care, Other (non HMO) | Attending: Hematology | Admitting: Hematology

## 2018-12-03 ENCOUNTER — Encounter (HOSPITAL_COMMUNITY): Payer: Self-pay | Admitting: Hematology

## 2018-12-03 VITALS — BP 138/65 | HR 85 | Temp 98.0°F | Resp 18 | Wt 211.0 lb

## 2018-12-03 DIAGNOSIS — R599 Enlarged lymph nodes, unspecified: Secondary | ICD-10-CM

## 2018-12-03 DIAGNOSIS — R591 Generalized enlarged lymph nodes: Secondary | ICD-10-CM | POA: Diagnosis present

## 2018-12-03 DIAGNOSIS — E119 Type 2 diabetes mellitus without complications: Secondary | ICD-10-CM | POA: Diagnosis not present

## 2018-12-03 NOTE — Assessment & Plan Note (Signed)
1.  Generalized lymphadenopathy: - Presentation with severe night sweats and fevers without any weight loss. -Admitted to the hospital from 09/27/2018 through 10/01/2018, underwent extensive work-up for fevers.  Connective tissue disorder work-up was negative. - CT scan of the CAP done on 09/28/2018 showed 1 cm anterior mediastinal lymph node.  There is also adenopathy in the upper abdomen around the GE junction and between portal vein and IVC where there is a 5.2 cm lymph node present.  There is a 1.2 cm lymph node near the hepatic hilum.  Brain MRI was negative. -LDH was elevated.  She also had mild elevation of AST and ALT. - Left axillary lymph node excision biopsy on 10/22/2018 did not show any evidence of lymphoma.  Flow cytometry was negative.  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   - PET CT scan on 10/17/2018 shows bilateral hypermetabolic level 2 neck lymph nodes, subcentimeter, with an SUV of 4.3.  Bilateral axillary nodes with left axillary SUV of 4.7.  Hypermetabolic and enlarged spleen present.  Spleen measures 13.1 x 5.9 x 12.8 cm.  Volume is 520 cm.  There is a cluster of hypermetabolic lymph nodes in the porta hepatis, 15 mm short axis with SUV of 7.6.  Mildly hypermetabolic external iliac and right inguinal lymph nodes with SUV of 2.7. - EUS guided biopsy of the periportal lymph node on 11/22/2018 shows lymphoid tissue present with no evidence of lymphoma. - Patient denies any fevers or night sweats.  She is beginning to feel better. -I did not recommend any further work-up at this time.  We will monitor her closely.  I will see her back in 6 weeks with repeat labs including LDH level. -I also plan to repeat her scans to make sure her lymphadenopathy has  resolved.  2.  Family history: -Father had liver cancer. -Brother had prostate cancer.

## 2018-12-03 NOTE — Progress Notes (Signed)
[facility] [address] [place], [place] [id number]   CLINIC:  Medical Oncology/Hematology  PCP:  Patient, No Pcp Per No address on file None   REASON FOR VISIT:  Follow-up for lymphadenopathy      BRIEF ONCOLOGIC HISTORY:   No history exists.     CANCER STAGING: Cancer Staging No matching staging information was found for the patient.   INTERVAL HISTORY:  [name] 38 y.o. female returns for routine follow-up. She is here today with her husband. She states that she feels much better. She states that she is not having any pain at this time. Denies any night sweats. Denies any nausea, vomiting, or diarrhea. Denies any new pains. Had not noticed any recent bleeding such as epistaxis, hematuria or hematochezia. Denies recent chest pain on exertion, shortness of breath on minimal exertion, pre-syncopal episodes, or palpitations. Denies any numbness or tingling in hands or feet. Denies any recent fevers, infections, or recent hospitalizations. Patient reports appetite at 100% and energy level at 100%.   REVIEW OF SYSTEMS:  Review of Systems  All other systems reviewed and are negative.    PAST MEDICAL/SURGICAL HISTORY:  Past Medical History:  Diagnosis Date  . Anxiety   . Depression   . Diabetes mellitus without complication (HCC)    Type II   Past Surgical History:  Procedure Laterality Date  . AXILLARY LYMPH NODE BIOPSY Left 10/22/2018   Procedure: AXILLARY LYMPH NODE BIOPSY;  Surgeon: Franky MachoJenkins, Mark, MD;  Location: [facility];  Service: General;  Laterality: Left;  . BIOPSY  11/07/2018   Procedure: BIOPSY;  Surgeon: Mansouraty, Netty StarringGabriel Jr., MD;  [facility];  Service: Endoscopy;;  . CESAREAN SECTION    . ESOPHAGOGASTRODUODENOSCOPY (EGD) WITH PROPOFOL N/A 11/07/2018   Procedure: ESOPHAGOGASTRODUODENOSCOPY (EGD) WITH PROPOFOL;  Surgeon: Meridee ScoreMansouraty, Netty StarringGabriel Jr., MD;  [facility];  Service: Endoscopy;  Laterality: N/A;  .  ESOPHAGOGASTRODUODENOSCOPY (EGD) WITH PROPOFOL N/A 11/22/2018   Procedure: ESOPHAGOGASTRODUODENOSCOPY (EGD) WITH PROPOFOL;  Surgeon: Rachael FeeJacobs, Daniel P, MD;  Location: [facility] ENDOSCOPY;  Service: Endoscopy;  Laterality: N/A;  . EUS N/A 11/22/2018   Procedure: UPPER ENDOSCOPIC ULTRASOUND (EUS) RADIAL;  Surgeon: Rachael FeeJacobs, Daniel P, MD;  Location: [facility] ENDOSCOPY;  Service: Endoscopy;  Laterality: N/A;  . FINE NEEDLE ASPIRATION N/A 11/22/2018   Procedure: FINE NEEDLE ASPIRATION (FNA) LINEAR;  Surgeon: Rachael FeeJacobs, Daniel P, MD;  Location: [facility] ENDOSCOPY;  Service: Endoscopy;  Laterality: N/A;  . TONSILLECTOMY       SOCIAL HISTORY:  Social History   Socioeconomic History  . Marital status: Married    Spouse name: Not on file  . Number of children: Not on file  . Years of education: Not on file  . Highest education level: Not on file  Occupational History  . Not on file  Social Needs  . Financial resource strain: Not on file  . Food insecurity:    Worry: Not on file    Inability: Not on file  . Transportation needs:    Medical: Not on file    Non-medical: Not on file  Tobacco Use  . Smoking status: Never Smoker  . Smokeless tobacco: Never Used  Substance and Sexual Activity  . Alcohol use: Not Currently  . Drug use: No  . Sexual activity: Yes  Lifestyle  . Physical activity:    Days per week: Not on file    Minutes per session: Not on file  . Stress: Not on file  Relationships  . Social connections:  Talks on phone: Not on file    Gets together: Not on file    Attends religious service: Not on file    Active member of club or organization: Not on file    Attends meetings of clubs or organizations: Not on file    Relationship status: Not on file  . Intimate partner violence:    Fear of current or ex partner: Not on file    Emotionally abused: Not on file    Physically abused: Not on file    Forced sexual activity: Not on file  Other Topics Concern  . Not on file  Social History Narrative   . Not on file    FAMILY HISTORY:  Family History  Problem Relation Age of Onset  . Hypertension Father   . Stroke Father   . Cancer - Other Father   . Seizures Father   . Diabetes Father   . Heart attack Father   . Cancer Brother   . Heart attack Paternal Uncle   . Heart attack Paternal Grandfather     CURRENT MEDICATIONS:  Outpatient Encounter Medications as of 12/03/2018  Medication Sig  . desvenlafaxine (PRISTIQ) 50 MG 24 hr tablet Take 50 mg by mouth daily.  Marland Kitchen liraglutide (VICTOZA) 18 MG/3ML SOPN Inject 1.8 mg into the skin.  Marland Kitchen omeprazole (PRILOSEC) 40 MG capsule Take 1 capsule (40 mg total) by mouth daily for 30 days.  . [DISCONTINUED] HYDROcodone-acetaminophen (NORCO) 5-325 MG tablet Take 1 tablet by mouth every 4 (four) hours as needed for moderate pain.  . TRI-SPRINTEC 0.18/0.215/0.25 MG-35 MCG tablet    No facility-administered encounter medications on file as of 12/03/2018.     ALLERGIES:  Allergies  Allergen Reactions  . Percocet [Oxycodone-Acetaminophen] Other (See Comments)    When taking it for a few days in a row  . Omniflox [Temafloxacin]   . Latex Rash    Only when on face  . Penicillins Rash    Has patient had a PCN reaction causing immediate rash, facial/tongue/throat swelling, SOB or lightheadedness with hypotension: Yes Has patient had a PCN reaction causing severe rash involving mucus membranes or skin necrosis: No Has patient had a PCN reaction that required hospitalization No Has patient had a PCN reaction occurring within the last 10 years: No If all of the above answers are "NO", then may proceed with Cephalosporin use.      PHYSICAL EXAM:  ECOG Performance status: 0  Vitals:   12/03/18 1018  BP: 138/65  Pulse: 85  Resp: 18  Temp: 98 F (36.7 C)  SpO2: 100%   Filed Weights   12/03/18 1018  Weight: 211 lb (95.7 kg)    Physical Exam Constitutional:      Appearance: Normal appearance.  Cardiovascular:     Rate and Rhythm:  Normal rate and regular rhythm.  Pulmonary:     Effort: Pulmonary effort is normal.     Breath sounds: Normal breath sounds.  Abdominal:     General: Bowel sounds are normal. There is no distension.     Palpations: Abdomen is soft. There is no mass.  Musculoskeletal:        General: No swelling.  Skin:    General: Skin is warm.  Neurological:     General: No focal deficit present.     Mental Status: She is alert and oriented to person, place, and time.  Psychiatric:        Mood and Affect: Mood normal.  Behavior: Behavior normal.      LABORATORY DATA:  I have reviewed the labs as listed.  CBC    Component Value Date/Time   WBC 4.9 09/30/2018 0758   RBC 4.11 09/30/2018 0758   HGB 12.1 09/30/2018 0758   HCT 36.8 09/30/2018 0758   PLT 177 09/30/2018 0758   MCV 89.5 09/30/2018 0758   MCH 29.4 09/30/2018 0758   MCHC 32.9 09/30/2018 0758   RDW 13.1 09/30/2018 0758   LYMPHSABS 2.5 09/30/2018 0758   MONOABS 0.7 09/30/2018 0758   EOSABS 0.0 09/30/2018 0758   BASOSABS 0.1 09/30/2018 0758   CMP Latest Ref Rng & Units 10/05/2018 09/30/2018 09/29/2018  Glucose 70 - 99 mg/dL 643(P) 295(J) 884(Z)  BUN 6 - 20 mg/dL 9 9 6   Creatinine 0.44 - 1.00 mg/dL 6.60 6.30 1.60  Sodium 135 - 145 mmol/L 133(L) 131(L) 134(L)  Potassium 3.5 - 5.1 mmol/L 4.3 3.7 4.0  Chloride 98 - 111 mmol/L 100 98 101  CO2 22 - 32 mmol/L 26 22 25   Calcium 8.9 - 10.3 mg/dL 8.9 1.0(X) 3.2(T)  Total Protein 6.5 - 8.1 g/dL 5.5(D) 7.7 7.1  Total Bilirubin 0.3 - 1.2 mg/dL 3.2(K) 1.2 0.7  Alkaline Phos 38 - 126 U/L 347(H) 143(H) 109  AST 15 - 41 U/L 83(H) 88(H) 82(H)  ALT 0 - 44 U/L 81(H) 82(H) 70(H)       DIAGNOSTIC IMAGING:  I have independently reviewed the scans and discussed with the patient.   I have reviewed Willy Eddy LPN's note and agree with the documentation.  I personally performed a face-to-face visit, made revisions and my assessment and plan is as follows.    ASSESSMENT & PLAN:    Adenopathy 1.  Generalized lymphadenopathy: - Presentation with severe night sweats and fevers without any weight loss. -Admitted to the hospital from 09/27/2018 through 10/01/2018, underwent extensive work-up for fevers.  Connective tissue disorder work-up was negative. - CT scan of the CAP done on 09/28/2018 showed 1 cm anterior mediastinal lymph node.  There is also adenopathy in the upper abdomen around the GE junction and between portal vein and IVC where there is a 5.2 cm lymph node present.  There is a 1.2 cm lymph node near the hepatic hilum.  Brain MRI was negative. -LDH was elevated.  She also had mild elevation of AST and ALT. - Left axillary lymph node excision biopsy on 10/22/2018 did not show any evidence of lymphoma.  Flow cytometry was negative.  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   - PET CT scan on 10/17/2018 shows bilateral hypermetabolic level 2 neck lymph nodes, subcentimeter, with an SUV of 4.3.  Bilateral axillary nodes with left axillary SUV of 4.7.  Hypermetabolic and enlarged spleen present.  Spleen measures 13.1 x 5.9 x 12.8 cm.  Volume is 520 cm.  There is a cluster of hypermetabolic lymph nodes in the porta hepatis, 15 mm short axis with SUV of 7.6.  Mildly hypermetabolic external iliac and right inguinal lymph nodes with SUV of 2.7. - EUS guided biopsy of the periportal lymph node on 11/22/2018 shows lymphoid tissue present with no evidence of lymphoma. - Patient denies any fevers or night sweats.  She is beginning to feel better. -I did not recommend any further work-up at this time.  We will monitor her closely.  I will see her back in 6 weeks with repeat labs including LDH level. -I also plan to repeat her scans  to make sure her lymphadenopathy has  resolved.  2.  Family history: -Father had liver cancer. -Brother had prostate cancer.      Orders placed this encounter:  Orders Placed This Encounter  Procedures  . CBC with Differential/Platelet  .  Comprehensive metabolic panel  . Lactate dehydrogenase      Doreatha Massed, MD [facility] [phone]

## 2018-12-03 NOTE — Patient Instructions (Addendum)
[facility] at [facility] Discharge Instructions  You were seen today by Dr. Ellin Saba. He went over your recent results. He will continue observation and repeat blood work. He will see you back in 6 weeks for labs and follow up.   Thank you for choosing [facility] at [facility] to provide your oncology and hematology care.  To afford each patient quality time with our provider, please arrive at least 15 minutes before your scheduled appointment time.   If you have a lab appointment with the Cancer Center please come in thru the  Main Entrance and check in at the main information desk  You need to re-schedule your appointment should you arrive 10 or more minutes late.  We strive to give you quality time with our providers, and arriving late affects you and other patients whose appointments are after yours.  Also, if you no show three or more times for appointments you may be dismissed from the clinic at the providers discretion.     Again, thank you for choosing [facility].  Our hope is that these requests will decrease the amount of time that you wait before being seen by our physicians.       _____________________________________________________________  Should you have questions after your visit to [facility], please contact our office at [phone] between the hours of 8:00 a.m. and 4:30 p.m.  Voicemails left after 4:00 p.m. will not be returned until the following business day.  For prescription refill requests, have your pharmacy contact our office and allow 72 hours.    Cancer Center Support Programs:   > Cancer Support Group  2nd Tuesday of the month 1pm-2pm, Journey Room

## 2019-01-11 ENCOUNTER — Inpatient Hospital Stay (HOSPITAL_COMMUNITY): Payer: Managed Care, Other (non HMO) | Attending: Hematology

## 2019-01-11 ENCOUNTER — Other Ambulatory Visit: Payer: Self-pay

## 2019-01-11 DIAGNOSIS — R591 Generalized enlarged lymph nodes: Secondary | ICD-10-CM | POA: Diagnosis not present

## 2019-01-11 LAB — CBC WITH DIFFERENTIAL/PLATELET
Abs Immature Granulocytes: 0.02 10*3/uL (ref 0.00–0.07)
Basophils Absolute: 0 10*3/uL (ref 0.0–0.1)
Basophils Relative: 0 %
Eosinophils Absolute: 0.1 10*3/uL (ref 0.0–0.5)
Eosinophils Relative: 2 %
HCT: 38 % (ref 36.0–46.0)
Hemoglobin: 12.8 g/dL (ref 12.0–15.0)
Immature Granulocytes: 0 %
Lymphocytes Relative: 42 %
Lymphs Abs: 2.1 10*3/uL (ref 0.7–4.0)
MCH: 29.5 pg (ref 26.0–34.0)
MCHC: 33.7 g/dL (ref 30.0–36.0)
MCV: 87.6 fL (ref 80.0–100.0)
Monocytes Absolute: 0.5 10*3/uL (ref 0.1–1.0)
Monocytes Relative: 10 %
Neutro Abs: 2.3 10*3/uL (ref 1.7–7.7)
Neutrophils Relative %: 46 %
Platelets: 275 10*3/uL (ref 150–400)
RBC: 4.34 MIL/uL (ref 3.87–5.11)
RDW: 13 % (ref 11.5–15.5)
WBC: 5.1 10*3/uL (ref 4.0–10.5)
nRBC: 0 % (ref 0.0–0.2)

## 2019-01-11 LAB — COMPREHENSIVE METABOLIC PANEL
ALT: 27 U/L (ref 0–44)
AST: 42 U/L — ABNORMAL HIGH (ref 15–41)
Albumin: 3.9 g/dL (ref 3.5–5.0)
Alkaline Phosphatase: 57 U/L (ref 38–126)
Anion gap: 10 (ref 5–15)
BUN: 11 mg/dL (ref 6–20)
CO2: 24 mmol/L (ref 22–32)
Calcium: 9.3 mg/dL (ref 8.9–10.3)
Chloride: 102 mmol/L (ref 98–111)
Creatinine, Ser: 0.59 mg/dL (ref 0.44–1.00)
GFR calc Af Amer: >60 mL/min (ref >60)
GFR calc non Af Amer: >60 mL/min (ref >60)
Glucose, Bld: 169 mg/dL — ABNORMAL HIGH (ref 70–99)
Potassium: 4.3 mmol/L (ref 3.5–5.1)
Sodium: 136 mmol/L (ref 135–145)
Total Bilirubin: 0.1 mg/dL — ABNORMAL LOW (ref 0.3–1.2)
Total Protein: 7.9 g/dL (ref 6.5–8.1)

## 2019-01-11 LAB — LACTATE DEHYDROGENASE: LDH: 124 U/L (ref 98–192)

## 2019-01-18 ENCOUNTER — Inpatient Hospital Stay (HOSPITAL_COMMUNITY): Payer: Managed Care, Other (non HMO) | Attending: Hematology | Admitting: Hematology

## 2019-01-18 ENCOUNTER — Other Ambulatory Visit: Payer: Self-pay

## 2019-01-18 ENCOUNTER — Encounter (HOSPITAL_COMMUNITY): Payer: Self-pay | Admitting: Hematology

## 2019-01-18 VITALS — BP 150/75 | HR 89 | Temp 98.2°F | Resp 18 | Wt 201.8 lb

## 2019-01-18 DIAGNOSIS — K219 Gastro-esophageal reflux disease without esophagitis: Secondary | ICD-10-CM | POA: Diagnosis not present

## 2019-01-18 DIAGNOSIS — E119 Type 2 diabetes mellitus without complications: Secondary | ICD-10-CM | POA: Diagnosis not present

## 2019-01-18 DIAGNOSIS — R591 Generalized enlarged lymph nodes: Secondary | ICD-10-CM | POA: Insufficient documentation

## 2019-01-18 DIAGNOSIS — R599 Enlarged lymph nodes, unspecified: Secondary | ICD-10-CM

## 2019-01-18 DIAGNOSIS — Z79899 Other long term (current) drug therapy: Secondary | ICD-10-CM | POA: Diagnosis not present

## 2019-01-18 DIAGNOSIS — Z800 Family history of malignant neoplasm of digestive organs: Secondary | ICD-10-CM | POA: Insufficient documentation

## 2019-01-18 DIAGNOSIS — Z8042 Family history of malignant neoplasm of prostate: Secondary | ICD-10-CM | POA: Insufficient documentation

## 2019-01-18 NOTE — Assessment & Plan Note (Signed)
1.  Generalized lymphadenopathy: - Presentation with severe night sweats and fevers without any weight loss. -Admitted to the hospital from 09/27/2018 through 10/01/2018, underwent extensive work-up for fevers.  Connective tissue disorder work-up was negative. - CT scan of the CAP done on 09/28/2018 showed 1 cm anterior mediastinal lymph node.  There is also adenopathy in the upper abdomen around the GE junction and between portal vein and IVC where there is a 5.2 cm lymph node present.  There is a 1.2 cm lymph node near the hepatic hilum.  Brain MRI was negative. -LDH was elevated.  She also had mild elevation of AST and ALT. - Left axillary lymph node excision biopsy on 10/22/2018 did not show any evidence of lymphoma.  Flow cytometry was negative.  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   - PET CT scan on 10/17/2018 shows bilateral hypermetabolic level 2 neck lymph nodes, subcentimeter, with an SUV of 4.3.  Bilateral axillary nodes with left axillary SUV of 4.7.  Hypermetabolic and enlarged spleen present.  Spleen measures 13.1 x 5.9 x 12.8 cm.  Volume is 520 cm.  There is a cluster of hypermetabolic lymph nodes in the porta hepatis, 15 mm short axis with SUV of 7.6.  Mildly hypermetabolic external iliac and right inguinal lymph nodes with SUV of 2.7. - EUS guided biopsy of the periportal lymph node on 11/22/2018 shows lymphoid tissue present with no evidence of lymphoma. - Patient denies any fevers or night sweats.  No Lymphadenopathy. She overall is feeling well.  - We will monitor her closely.  --Recommend repeat labs in 8 weeks with repeat CT- CAP.  2.  Family history: -Father had liver cancer. -Brother had prostate cancer.

## 2019-01-18 NOTE — Patient Instructions (Addendum)
[facility] at [facility] Discharge Instructions  You were seen today by Dr. Katragadda. He went over your recent lab results. He will see you back in  for labs and follow up.   Thank you for choosing [facility] at [facility] to provide your oncology and hematology care.  To afford each patient quality time with our provider, please arrive at least 15 minutes before your scheduled appointment time.   If you have a lab appointment with the Cancer Center please come in thru the  Main Entrance and check in at the main information desk  You need to re-schedule your appointment should you arrive 10 or more minutes late.  We strive to give you quality time with our providers, and arriving late affects you and other patients whose appointments are after yours.  Also, if you no show three or more times for appointments you may be dismissed from the clinic at the providers discretion.     Again, thank you for choosing [facility].  Our hope is that these requests will decrease the amount of time that you wait before being seen by our physicians.       _____________________________________________________________  Should you have questions after your visit to  Cancer Center, please contact our office at [phone] between the hours of 8:00 a.m. and 4:30 p.m.  Voicemails left after 4:00 p.m. will not be returned until the following business day.  For prescription refill requests, have your pharmacy contact our office and allow 72 hours.    Cancer Center Support Programs:   > Cancer Support Group  2nd Tuesday of the month 1pm-2pm, Journey Room    

## 2019-01-18 NOTE — Progress Notes (Signed)
[facility] [address], [place] [address]   CLINIC:  Medical Oncology/Hematology  PCP:  Patient, No Pcp Per No address on file None   REASON FOR VISIT:  Follow-up for lymphadenopathy    INTERVAL HISTORY:  Crystal Mccoy 38 y.o. female returns for routine follow-up. She is here today alone. She states that she has been feeling well since her last visit. Denies any nausea, vomiting, or diarrhea. Denies any new pains. Had not noticed any recent bleeding such as epistaxis, hematuria or hematochezia. Denies recent chest pain on exertion, shortness of breath on minimal exertion, pre-syncopal episodes, or palpitations. Denies any numbness or tingling in hands or feet. Denies any recent fevers, infections, or recent hospitalizations. Patient reports appetite at 100% and energy level at 100%.   REVIEW OF SYSTEMS:  Review of Systems  All other systems reviewed and are negative.    PAST MEDICAL/SURGICAL HISTORY:  Past Medical History:  Diagnosis Date   Anxiety    Depression    Diabetes mellitus without complication (HCC)    Type II   Past Surgical History:  Procedure Laterality Date   AXILLARY LYMPH NODE BIOPSY Left 10/22/2018   Procedure: AXILLARY LYMPH NODE BIOPSY;  Surgeon: Franky Macho, MD;  Location: [facility];  Service: General;  Laterality: Left;   BIOPSY  11/07/2018   Procedure: BIOPSY;  Surgeon: Lemar Lofty., MD;  Location: [facility] ENDOS[facility];  Service: Endoscopy;;   CESAREAN SECTION     ESOPHAGOGASTRODUODENOSCOPY (EGD) WITH PROPOFOL N/A 11/07/2018   Procedure: ESOPHAGOGASTRODUODENOSCOPY (EGD) WITH PROPOFOL;  Surgeon: Lemar Lofty., MD;  Location: [facility];  Service: Endoscopy;  Laterality: N/A;   ESOPHAGOGASTRODUODENOSCOPY (EGD) WITH PROPOFOL N/A 11/22/2018   Procedure: ESOPHAGOGASTRODUODENOSCOPY (EGD) WITH PROPOFOL;  Surgeon: Rachael Fee, MD;  Location: [facility] ENDOSCOPY;  Service: Endoscopy;  Laterality: N/A;   EUS N/A 11/22/2018     Procedure: UPPER ENDOSCOPIC ULTRASOUND (EUS) RADIAL;  Surgeon: Rachael Fee, MD;  Location: [facility];  Service: Endoscopy;  Laterality: N/A;   FINE NEEDLE ASPIRATION N/A 11/22/2018   Procedure: FINE NEEDLE ASPIRATION (FNA) LINEAR;  Surgeon: Rachael Fee, MD;  [facility]: [facility];  Service: Endoscopy;  Laterality: N/A;   TONSILLECTOMY       SOCIAL HISTORY:  Social History   Socioeconomic History   Marital status: Married    Spouse name: Not on file   Number of children: Not on file   Years of education: Not on file   Highest education level: Not on file  Occupational History   Not on file  Social Needs   Financial resource strain: Not on file   Food insecurity:    Worry: Not on file    Inability: Not on file   Transportation needs:    Medical: Not on file    Non-medical: Not on file  Tobacco Use   Smoking status: Never Smoker   Smokeless tobacco: Never Used  Substance and Sexual Activity   Alcohol use: Not Currently   Drug use: No   Sexual activity: Yes  Lifestyle   Physical activity:    Days per week: Not on file    Minutes per session: Not on file   Stress: Not on file  Relationships   Social connections:    Talks on phone: Not on file    Gets together: Not on file    Attends religious service: Not on file    Active member of club or organization: Not on file  Attends meetings of clubs or organizations: Not on file    Relationship status: Not on file   Intimate partner violence:    Fear of current or ex partner: Not on file    Emotionally abused: Not on file    Physically abused: Not on file    Forced sexual activity: Not on file  Other Topics Concern   Not on file  Social History Narrative   Not on file    FAMILY HISTORY:  Family History  Problem Relation Age of Onset   Hypertension Father    Stroke Father    Cancer - Other Father    Seizures Father    Diabetes Father    Heart attack Father    Cancer  Brother    Heart attack Paternal Uncle    Heart attack Paternal Grandfather     CURRENT MEDICATIONS:  Outpatient Encounter Medications as of 01/18/2019  Medication Sig   busPIRone (BUSPAR) 5 MG tablet    liraglutide (VICTOZA) 18 MG/3ML SOPN Inject 1.8 mg into the skin.   omeprazole (PRILOSEC) 40 MG capsule    traZODone (DESYREL) 50 MG tablet Take 50 mg by mouth at bedtime.    TRI-SPRINTEC 0.18/0.215/0.25 MG-35 MCG tablet Take 1 tablet by mouth daily.    desvenlafaxine (PRISTIQ) 50 MG 24 hr tablet Take 100 mg by mouth daily.    omeprazole (PRILOSEC) 40 MG capsule Take 1 capsule (40 mg total) by mouth daily for 30 days.   No facility-administered encounter medications on file as of 01/18/2019.     ALLERGIES:  Allergies  Allergen Reactions   Percocet [Oxycodone-Acetaminophen] Other (See Comments)    When taking it for a few days in a row   Omniflox [Temafloxacin]    Latex Rash    Only when on face   Penicillins Rash    Has patient had a PCN reaction causing immediate rash, facial/tongue/throat swelling, SOB or lightheadedness with hypotension: Yes Has patient had a PCN reaction causing severe rash involving mucus membranes or skin necrosis: No Has patient had a PCN reaction that required hospitalization No Has patient had a PCN reaction occurring within the last 10 years: No If all of the above answers are "NO", then may proceed with Cephalosporin use.      PHYSICAL EXAM:  ECOG Performance status: 1  Vitals:   01/18/19 0826  BP: (!) 150/75  Pulse: 89  Resp: 18  Temp: 98.2 F (36.8 C)  SpO2: 99%   Filed Weights   01/18/19 0826  Weight: 201 lb 12.8 oz (91.5 kg)    Physical Exam Vitals signs reviewed.  Constitutional:      Appearance: Normal appearance.  Cardiovascular:     Rate and Rhythm: Normal rate and regular rhythm.     Heart sounds: Normal heart sounds.  Pulmonary:     Effort: Pulmonary effort is normal.     Breath sounds: Normal breath sounds.   Abdominal:     General: There is no distension.     Palpations: Abdomen is soft. There is no mass.  Musculoskeletal:        General: No swelling.  Skin:    General: Skin is warm.  Neurological:     General: No focal deficit present.     Mental Status: She is alert and oriented to person, place, and time.  Psychiatric:        Mood and Affect: Mood normal.        Behavior: Behavior normal.  LABORATORY DATA:  I have reviewed the labs as listed.  CBC    Component Value Date/Time   WBC 5.1 01/11/2019 1038   RBC 4.34 01/11/2019 1038   HGB 12.8 01/11/2019 1038   HCT 38.0 01/11/2019 1038   PLT 275 01/11/2019 1038   MCV 87.6 01/11/2019 1038   MCH 29.5 01/11/2019 1038   MCHC 33.7 01/11/2019 1038   RDW 13.0 01/11/2019 1038   LYMPHSABS 2.1 01/11/2019 1038   MONOABS 0.5 01/11/2019 1038   EOSABS 0.1 01/11/2019 1038   BASOSABS 0.0 01/11/2019 1038   CMP Latest Ref Rng & Units 01/11/2019 10/05/2018 09/30/2018  Glucose 70 - 99 mg/dL 045(W169(H) 098(J118(H) 191(Y151(H)  BUN 6 - 20 mg/dL 11 9 9   Creatinine 0.44 - 1.00 mg/dL 7.820.59 9.560.49 2.130.55  Sodium 135 - 145 mmol/L 136 133(L) 131(L)  Potassium 3.5 - 5.1 mmol/L 4.3 4.3 3.7  Chloride 98 - 111 mmol/L 102 100 98  CO2 22 - 32 mmol/L 24 26 22   Calcium 8.9 - 10.3 mg/dL 9.3 8.9 0.8(M8.6(L)  Total Protein 6.5 - 8.1 g/dL 7.9 5.7(Q8.4(H) 7.7  Total Bilirubin 0.3 - 1.2 mg/dL 4.6(N0.1(L) 1.3(H) 1.2  Alkaline Phos 38 - 126 U/L 57 347(H) 143(H)  AST 15 - 41 U/L 42(H) 83(H) 88(H)  ALT 0 - 44 U/L 27 81(H) 82(H)       DIAGNOSTIC IMAGING:  I have independently reviewed the scans and discussed with the patient.   I have reviewed Willy EddyAshley Noell Shular LPN's note and agree with the documentation.  I personally performed a face-to-face visit, made revisions and my assessment and plan is as follows.    ASSESSMENT & PLAN:   Adenopathy 1.  Generalized lymphadenopathy: - Presentation with severe night sweats and fevers without any weight loss. -Admitted to the hospital from  09/27/2018 through 10/01/2018, underwent extensive work-up for fevers.  Connective tissue disorder work-up was negative. - CT scan of the CAP done on 09/28/2018 showed 1 cm anterior mediastinal lymph node.  There is also adenopathy in the upper abdomen around the GE junction and between portal vein and IVC where there is a 5.2 cm lymph node present.  There is a 1.2 cm lymph node near the hepatic hilum.  Brain MRI was negative. -LDH was elevated.  She also had mild elevation of AST and ALT. - Left axillary lymph node excision biopsy on 10/22/2018 did not show any evidence of lymphoma.  Flow cytometry was negative.  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   - PET CT scan on 10/17/2018 shows bilateral hypermetabolic level 2 neck lymph nodes, subcentimeter, with an SUV of 4.3.  Bilateral axillary nodes with left axillary SUV of 4.7.  Hypermetabolic and enlarged spleen present.  Spleen measures 13.1 x 5.9 x 12.8 cm.  Volume is 520 cm.  There is a cluster of hypermetabolic lymph nodes in the porta hepatis, 15 mm short axis with SUV of 7.6.  Mildly hypermetabolic external iliac and right inguinal lymph nodes with SUV of 2.7. - EUS guided biopsy of the periportal lymph node on 11/22/2018 shows lymphoid tissue present with no evidence of lymphoma. - Patient denies any fevers or night sweats.  No Lymphadenopathy. She overall is feeling well.  - We will monitor her closely.  --Recommend repeat labs in 8 weeks with repeat CT- CAP.  2.  Family history: -Father had liver cancer. -Brother had prostate cancer.      Orders placed this encounter:  Orders Placed This Encounter  Procedures   CT Chest W Contrast   CT Abdomen Pelvis W Contrast   CBC with Differential/Platelet   Comprehensive metabolic panel   Lactate dehydrogenase      Doreatha Massed, MD [facility] [phone]

## 2019-03-11 ENCOUNTER — Other Ambulatory Visit: Payer: Self-pay | Admitting: Gastroenterology

## 2019-03-20 ENCOUNTER — Inpatient Hospital Stay (HOSPITAL_COMMUNITY): Payer: Managed Care, Other (non HMO) | Attending: Hematology

## 2019-03-20 ENCOUNTER — Ambulatory Visit (HOSPITAL_COMMUNITY)
Admission: RE | Admit: 2019-03-20 | Discharge: 2019-03-20 | Disposition: A | Discharge status: Home / Self Care | Payer: Managed Care, Other (non HMO) | Source: Ambulatory Visit | Attending: Hematology | Admitting: Hematology

## 2019-03-20 ENCOUNTER — Other Ambulatory Visit: Payer: Self-pay

## 2019-03-20 DIAGNOSIS — E119 Type 2 diabetes mellitus without complications: Secondary | ICD-10-CM | POA: Insufficient documentation

## 2019-03-20 DIAGNOSIS — R591 Generalized enlarged lymph nodes: Secondary | ICD-10-CM | POA: Insufficient documentation

## 2019-03-20 DIAGNOSIS — Z8249 Family history of ischemic heart disease and other diseases of the circulatory system: Secondary | ICD-10-CM | POA: Insufficient documentation

## 2019-03-20 DIAGNOSIS — Z8042 Family history of malignant neoplasm of prostate: Secondary | ICD-10-CM | POA: Insufficient documentation

## 2019-03-20 DIAGNOSIS — K219 Gastro-esophageal reflux disease without esophagitis: Secondary | ICD-10-CM | POA: Insufficient documentation

## 2019-03-20 DIAGNOSIS — Z803 Family history of malignant neoplasm of breast: Secondary | ICD-10-CM | POA: Insufficient documentation

## 2019-03-20 DIAGNOSIS — Z79899 Other long term (current) drug therapy: Secondary | ICD-10-CM | POA: Insufficient documentation

## 2019-03-20 LAB — CBC WITH DIFFERENTIAL/PLATELET
Abs Immature Granulocytes: 0.03 10*3/uL (ref 0.00–0.07)
Basophils Absolute: 0 10*3/uL (ref 0.0–0.1)
Basophils Relative: 1 %
Eosinophils Absolute: 0.2 10*3/uL (ref 0.0–0.5)
Eosinophils Relative: 2 %
HCT: 43.8 % (ref 36.0–46.0)
Hemoglobin: 14 g/dL (ref 12.0–15.0)
Immature Granulocytes: 0 %
Lymphocytes Relative: 42 %
Lymphs Abs: 2.8 10*3/uL (ref 0.7–4.0)
MCH: 29 pg (ref 26.0–34.0)
MCHC: 32 g/dL (ref 30.0–36.0)
MCV: 90.7 fL (ref 80.0–100.0)
Monocytes Absolute: 0.7 10*3/uL (ref 0.1–1.0)
Monocytes Relative: 11 %
Neutro Abs: 2.9 10*3/uL (ref 1.7–7.7)
Neutrophils Relative %: 44 %
Platelets: 311 10*3/uL (ref 150–400)
RBC: 4.83 MIL/uL (ref 3.87–5.11)
RDW: 13.5 % (ref 11.5–15.5)
WBC: 6.7 10*3/uL (ref 4.0–10.5)
nRBC: 0 % (ref 0.0–0.2)

## 2019-03-20 LAB — COMPREHENSIVE METABOLIC PANEL
ALT: 28 U/L (ref 0–44)
AST: 24 U/L (ref 15–41)
Albumin: 4.4 g/dL (ref 3.5–5.0)
Alkaline Phosphatase: 64 U/L (ref 38–126)
Anion gap: 13 (ref 5–15)
BUN: 11 mg/dL (ref 6–20)
CO2: 27 mmol/L (ref 22–32)
Calcium: 9.6 mg/dL (ref 8.9–10.3)
Chloride: 97 mmol/L — ABNORMAL LOW (ref 98–111)
Creatinine, Ser: 0.62 mg/dL (ref 0.44–1.00)
GFR calc Af Amer: >60 mL/min (ref >60)
GFR calc non Af Amer: >60 mL/min (ref >60)
Glucose, Bld: 169 mg/dL — ABNORMAL HIGH (ref 70–99)
Potassium: 4.5 mmol/L (ref 3.5–5.1)
Sodium: 137 mmol/L (ref 135–145)
Total Bilirubin: 0.7 mg/dL (ref 0.3–1.2)
Total Protein: 7.9 g/dL (ref 6.5–8.1)

## 2019-03-20 LAB — LACTATE DEHYDROGENASE: LDH: 133 U/L (ref 98–192)

## 2019-03-20 MED ORDER — IOHEXOL 300 MG/ML  SOLN
100.0000 mL | Freq: Once | INTRAMUSCULAR | Status: AC | PRN
  Administered 2019-03-20: 100 mL via INTRAVENOUS

## 2019-03-25 ENCOUNTER — Inpatient Hospital Stay (HOSPITAL_BASED_OUTPATIENT_CLINIC_OR_DEPARTMENT_OTHER): Payer: Managed Care, Other (non HMO) | Admitting: Hematology

## 2019-03-25 ENCOUNTER — Encounter (HOSPITAL_COMMUNITY): Payer: Self-pay | Admitting: Hematology

## 2019-03-25 ENCOUNTER — Other Ambulatory Visit: Payer: Self-pay

## 2019-03-25 DIAGNOSIS — Z8042 Family history of malignant neoplasm of prostate: Secondary | ICD-10-CM | POA: Diagnosis not present

## 2019-03-25 DIAGNOSIS — Z8249 Family history of ischemic heart disease and other diseases of the circulatory system: Secondary | ICD-10-CM | POA: Diagnosis not present

## 2019-03-25 DIAGNOSIS — R591 Generalized enlarged lymph nodes: Secondary | ICD-10-CM | POA: Diagnosis present

## 2019-03-25 DIAGNOSIS — R599 Enlarged lymph nodes, unspecified: Secondary | ICD-10-CM

## 2019-03-25 DIAGNOSIS — Z79899 Other long term (current) drug therapy: Secondary | ICD-10-CM

## 2019-03-25 DIAGNOSIS — E119 Type 2 diabetes mellitus without complications: Secondary | ICD-10-CM

## 2019-03-25 DIAGNOSIS — Z803 Family history of malignant neoplasm of breast: Secondary | ICD-10-CM | POA: Diagnosis not present

## 2019-03-25 DIAGNOSIS — K219 Gastro-esophageal reflux disease without esophagitis: Secondary | ICD-10-CM | POA: Diagnosis not present

## 2019-03-25 DIAGNOSIS — Z800 Family history of malignant neoplasm of digestive organs: Secondary | ICD-10-CM

## 2019-03-25 NOTE — Progress Notes (Signed)
[facility] [address] [place], [place] [id number]   CLINIC:  Medical Oncology/Hematology  PCP:  Patient, No Pcp Per No address on file None   REASON FOR VISIT:  Follow-up for lymphadenopathy    INTERVAL HISTORY:  Ms. Marita KansasVernon 38 y.o. female returns for follow-up of her lymphadenopathy.  Denies any fevers, night sweats or weight loss in the past few months.  Energy levels are back to normal.  No palpable lymphadenopathy noted.  Denies any nausea, vomiting, diarrhea or constipation.  No ER visits or hospitalizations.  Denies any bleeding per rectum or melena or change in bowel habits.   REVIEW OF SYSTEMS:  Review of Systems  All other systems reviewed and are negative.    PAST MEDICAL/SURGICAL HISTORY:  Past Medical History:  Diagnosis Date  . Anxiety   . Depression   . Diabetes mellitus without complication (HCC)    Type II   Past Surgical History:  Procedure Laterality Date  . AXILLARY LYMPH NODE BIOPSY Left 10/22/2018   Procedure: AXILLARY LYMPH NODE BIOPSY;  Surgeon: Franky MachoJenkins, Mark, MD;  Location: [facility];  Service: General;  Laterality: Left;  . BIOPSY  11/07/2018   Procedure: BIOPSY;  Surgeon: Mansouraty, Netty StarringGabriel Jr., MD;  [facility];  Service: Endoscopy;;  . CESAREAN SECTION    . ESOPHAGOGASTRODUODENOSCOPY (EGD) WITH PROPOFOL N/A 11/07/2018   Procedure: ESOPHAGOGASTRODUODENOSCOPY (EGD) WITH PROPOFOL;  Surgeon: Meridee ScoreMansouraty, Netty StarringGabriel Jr., MD;  [facility];  Service: Endoscopy;  Laterality: N/A;  . ESOPHAGOGASTRODUODENOSCOPY (EGD) WITH PROPOFOL N/A 11/22/2018   Procedure: ESOPHAGOGASTRODUODENOSCOPY (EGD) WITH PROPOFOL;  Surgeon: Rachael FeeJacobs, Daniel P, MD;  Location: [facility] ENDOS[facility];  Service: Endoscopy;  Laterality: N/A;  . EUS N/A 11/22/2018   Procedure: UPPER ENDOSCOPIC ULTRASOUND (EUS) RADIAL;  Surgeon: Rachael FeeJacobs, Daniel P, MD;  Location: [facility] ENDOS[facility];  Service: Endoscopy;  Laterality: N/A;  . FINE NEEDLE ASPIRATION N/A 11/22/2018   Procedure: FINE  NEEDLE ASPIRATION (FNA) LINEAR;  Surgeon: Rachael FeeJacobs, Daniel P, MD;  Location: [facility]OS[facility];  Service: Endoscopy;  Laterality: N/A;  . TONSILLECTOMY       SOCIAL HISTORY:  Social History   Socioeconomic History  . Marital status: Married    Spouse name: Not on file  . Number of children: Not on file  . Years of education: Not on file  . Highest education level: Not on file  Occupational History  . Not on file  Social Needs  . Financial resource strain: Not on file  . Food insecurity    Worry: Not on file    Inability: Not on file  . Transportation needs    Medical: Not on file    Non-medical: Not on file  Tobacco Use  . Smoking status: Never Smoker  . Smokeless tobacco: Never Used  Substance and Sexual Activity  . Alcohol use: Not Currently  . Drug use: No  . Sexual activity: Yes  Lifestyle  . Physical activity    Days per week: Not on file    Minutes per session: Not on file  . Stress: Not on file  Relationships  . Social Musicianconnections    Talks on phone: Not on file    Gets together: Not on file    Attends religious service: Not on file    Active member of club or organization: Not on file    Attends meetings of clubs or organizations: Not on file    Relationship status: Not on file  . Intimate partner violence    Fear of current  or ex partner: Not on file    Emotionally abused: Not on file    Physically abused: Not on file    Forced sexual activity: Not on file  Other Topics Concern  . Not on file  Social History Narrative  . Not on file    FAMILY HISTORY:  Family History  Problem Relation Age of Onset  . Hypertension Father   . Stroke Father   . Cancer - Other Father   . Seizures Father   . Diabetes Father   . Heart attack Father   . Cancer Brother   . Heart attack Paternal Uncle   . Heart attack Paternal Grandfather     CURRENT MEDICATIONS:  Outpatient Encounter Medications as of 03/25/2019  Medication Sig  . Semaglutide (OZEMPIC, 0.25 OR 0.5  MG/DOSE, [place]) Inject into the skin once a week.  . busPIRone (BUSPAR) 5 MG tablet Take 5 mg by mouth 2 (two) times daily.   Marland Kitchen. desvenlafaxine (PRISTIQ) 50 MG 24 hr tablet Take 100 mg by mouth daily.   Marland Kitchen. omeprazole (PRILOSEC) 40 MG capsule Take 40 mg by mouth daily.   . traZODone (DESYREL) 50 MG tablet Take 50 mg by mouth at bedtime.   . [DISCONTINUED] liraglutide (VICTOZA) 18 MG/3ML SOPN Inject 1.8 mg into the skin.  . [DISCONTINUED] omeprazole (PRILOSEC) 40 MG capsule TAKE (1) CAPSULE BY MOUTH ONCE DAILY.  . [DISCONTINUED] TRI-SPRINTEC 0.18/0.215/0.25 MG-35 MCG tablet Take 1 tablet by mouth daily.    No facility-administered encounter medications on file as of 03/25/2019.     ALLERGIES:  Allergies  Allergen Reactions  . Oxycodone-Acetaminophen Other (See Comments)    thrush  . Latex Rash and Itching    Only when on face  . Omniflox [Temafloxacin] Rash  . Penicillins Rash    Has patient had a PCN reaction causing immediate rash, facial/tongue/throat swelling, SOB or lightheadedness with hypotension: Yes Has patient had a PCN reaction causing severe rash involving mucus membranes or skin necrosis: No Has patient had a PCN reaction that required hospitalization No Has patient had a PCN reaction occurring within the last 10 years: No If all of the above answers are "NO", then may proceed with Cephalosporin use.      PHYSICAL EXAM:  ECOG Performance status: 1  Vitals:   03/25/19 0809  BP: 123/73  Pulse: 78  Resp: 14  Temp: 97.8 F (36.6 C)  SpO2: 100%   Filed Weights   03/25/19 0809  Weight: 207 lb (93.9 kg)    Physical Exam Vitals signs reviewed.  Constitutional:      Appearance: Normal appearance.  Cardiovascular:     Rate and Rhythm: Normal rate and regular rhythm.     Heart sounds: Normal heart sounds.  Pulmonary:     Effort: Pulmonary effort is normal.     Breath sounds: Normal breath sounds.  Abdominal:     General: There is no distension.     Palpations:  Abdomen is soft. There is no mass.  Musculoskeletal:        General: No swelling.  Skin:    General: Skin is warm.  Neurological:     General: No focal deficit present.     Mental Status: She is alert and oriented to person, place, and time.  Psychiatric:        Mood and Affect: Mood normal.        Behavior: Behavior normal.      LABORATORY DATA:  I have reviewed the labs as  listed.  CBC    Component Value Date/Time   WBC 6.7 03/20/2019 0807   RBC 4.83 03/20/2019 0807   HGB 14.0 03/20/2019 0807   HCT 43.8 03/20/2019 0807   PLT 311 03/20/2019 0807   MCV 90.7 03/20/2019 0807   MCH 29.0 03/20/2019 0807   MCHC 32.0 03/20/2019 0807   RDW 13.5 03/20/2019 0807   LYMPHSABS 2.8 03/20/2019 0807   MONOABS 0.7 03/20/2019 0807   EOSABS 0.2 03/20/2019 0807   BASOSABS 0.0 03/20/2019 0807   CMP Latest Ref Rng & Units 03/20/2019 01/11/2019 10/05/2018  Glucose 70 - 99 mg/dL 169(H) 169(H) 118(H)  BUN 6 - 20 mg/dL 11 11 9   Creatinine 0.44 - 1.00 mg/dL 0.62 0.59 0.49  Sodium 135 - 145 mmol/L 137 136 133(L)  Potassium 3.5 - 5.1 mmol/L 4.5 4.3 4.3  Chloride 98 - 111 mmol/L 97(L) 102 100  CO2 22 - 32 mmol/L 27 24 26   Calcium 8.9 - 10.3 mg/dL 9.6 9.3 8.9  Total Protein 6.5 - 8.1 g/dL 7.9 7.9 8.4(H)  Total Bilirubin 0.3 - 1.2 mg/dL 0.7 0.1(L) 1.3(H)  Alkaline Phos 38 - 126 U/L 64 57 347(H)  AST 15 - 41 U/L 24 42(H) 83(H)  ALT 0 - 44 U/L 28 27 81(H)       DIAGNOSTIC IMAGING:  I have independently reviewed the scans and discussed with the patient.   I have reviewed Venita Lick LPN's note and agree with the documentation.  I personally performed a face-to-face visit, made revisions and my assessment and plan is as follows.    ASSESSMENT & PLAN:   Adenopathy 1.  Generalized lymphadenopathy: - Presentation with severe night sweats and fever without weight loss, admitted to hospital from 09/27/2018 through 10/01/2018. -CT CAP on 09/28/2018 shows 1 cm anterior mediastinal lymph node,  adenopathy in the upper abdomen around the GE junction and between portal vein and IVC, 5.2 cm lymph node present.  There is a 1.2 cm lymph node at the hepatic hilum.  Brain MRI was negative. -LDH was elevated.  Mildly elevated AST and ALT.  Left axillary lymph node biopsy on 10/22/2018 was negative for lymphoma.  It stained for latent EBV.  EBV DNA was around 400. -PET scan on 10/17/2018 shows bilateral hypermetabolic level 2 neck lymph nodes, subcentimeter with SUV of 4.3.  Bilateral axillary adenopathy.  Hypermetabolic and enlarged spleen present.  Spleen measures 13.1 x 5.9 x 12.8 cm.  Cluster of hypermetabolic lymph nodes in the porta hepatis, 15 mm short axis with SUV of 7.6.  Mildly hypermetabolic external iliac and right inguinal lymph nodes. -EUS guided biopsy of the periportal lymph node on 11/22/2018 shows lymphoid tissue present with no evidence of lymphoma. -CT CAP on 03/20/2019 shows resolved lymphadenopathy in the chest, abdomen and pelvis. -Labs today shows normal LDH, CBC and LFTs. -Because of resolution of adenopathy, and lack of B symptoms, I have recommended no further follow-ups at this time. -I will be glad to see her on an as-needed basis.   Total time spent is 25 minutes with more than 50% of the time spent face-to-face discussing scan results, lab results, counseling and coordination of care.  Orders placed this encounter:  No orders of the defined types were placed in this encounter.     Derek Jack, MD [place] [phone]

## 2019-03-25 NOTE — Patient Instructions (Addendum)
[place] at [facility] Discharge Instructions  You were seen today by Dr. Delton Coombes. He reviewed how you have been feeling over the past few months.  Your CT scan showed all the lymphnodes are gone.  Your lab work is back to normal.  What he feels caused your problems was more of a viral problem and it has since resolved.  You can follow up with your primary physician and if you should need [place] in the future, don't hesitate to call.    Thank you for choosing [place] at [facility] to provide your oncology and hematology care.  To afford each patient quality time with our provider, please arrive at least 15 minutes before your scheduled appointment time.   If you have a lab appointment with the [facility] please come in thru the  Main Entrance and check in at the main information desk  You need to re-schedule your appointment should you arrive 10 or more minutes late.  We strive to give you quality time with our providers, and arriving late affects you and other patients whose appointments are after yours.  Also, if you no show three or more times for appointments you may be dismissed from the clinic at the providers discretion.     Again, thank you for choosing [facility]  Our hope is that these requests will decrease the amount of time that you wait before being seen by our physicians.       _____________________________________________________________  Should you have questions after your visit to [facility], please contact our office at [phone] between the hours of 8:00 a.m. and 4:30 p.m.  Voicemails left after 4:00 p.m. will not be returned until the following business day.  For prescription refill requests, have your pharmacy contact our office and allow 72 hours.    Cancer Center Support Programs:   > Cancer Support Group  2nd Tuesday of the month 1pm-2pm, Journey Room

## 2019-03-25 NOTE — Assessment & Plan Note (Addendum)
1.  Generalized lymphadenopathy: - Presentation with severe night sweats and fever without weight loss, admitted to hospital from 09/27/2018 through 10/01/2018. -CT CAP on 09/28/2018 shows 1 cm anterior mediastinal lymph node, adenopathy in the upper abdomen around the GE junction and between portal vein and IVC, 5.2 cm lymph node present.  There is a 1.2 cm lymph node at the hepatic hilum.  Brain MRI was negative. -LDH was elevated.  Mildly elevated AST and ALT.  Left axillary lymph node biopsy on 10/22/2018 was negative for lymphoma.  It stained for latent EBV.  EBV DNA was around 400. -PET scan on 10/17/2018 shows bilateral hypermetabolic level 2 neck lymph nodes, subcentimeter with SUV of 4.3.  Bilateral axillary adenopathy.  Hypermetabolic and enlarged spleen present.  Spleen measures 13.1 x 5.9 x 12.8 cm.  Cluster of hypermetabolic lymph nodes in the porta hepatis, 15 mm short axis with SUV of 7.6.  Mildly hypermetabolic external iliac and right inguinal lymph nodes. -EUS guided biopsy of the periportal lymph node on 11/22/2018 shows lymphoid tissue present with no evidence of lymphoma. -CT CAP on 03/20/2019 shows resolved lymphadenopathy in the chest, abdomen and pelvis. -Labs today shows normal LDH, CBC and LFTs. -Because of resolution of adenopathy, and lack of B symptoms, I have recommended no further follow-ups at this time. -I will be glad to see her on an as-needed basis.

## 2019-04-15 ENCOUNTER — Other Ambulatory Visit: Payer: Self-pay | Admitting: Obstetrics and Gynecology

## 2019-07-28 ENCOUNTER — Emergency Department (HOSPITAL_COMMUNITY): Payer: Managed Care, Other (non HMO)

## 2019-07-28 ENCOUNTER — Other Ambulatory Visit: Payer: Self-pay

## 2019-07-28 ENCOUNTER — Emergency Department (HOSPITAL_COMMUNITY)
Admission: EM | Admit: 2019-07-28 | Discharge: 2019-07-28 | Disposition: A | Discharge status: Home / Self Care | Payer: Managed Care, Other (non HMO) | Attending: Emergency Medicine | Admitting: Emergency Medicine

## 2019-07-28 ENCOUNTER — Encounter (HOSPITAL_COMMUNITY): Payer: Self-pay | Admitting: Emergency Medicine

## 2019-07-28 DIAGNOSIS — Y9367 Activity, basketball: Secondary | ICD-10-CM | POA: Diagnosis not present

## 2019-07-28 DIAGNOSIS — Z79899 Other long term (current) drug therapy: Secondary | ICD-10-CM | POA: Diagnosis not present

## 2019-07-28 DIAGNOSIS — S93491A Sprain of other ligament of right ankle, initial encounter: Secondary | ICD-10-CM

## 2019-07-28 DIAGNOSIS — Y92310 Basketball court as the place of occurrence of the external cause: Secondary | ICD-10-CM | POA: Insufficient documentation

## 2019-07-28 DIAGNOSIS — E119 Type 2 diabetes mellitus without complications: Secondary | ICD-10-CM | POA: Insufficient documentation

## 2019-07-28 DIAGNOSIS — S93431A Sprain of tibiofibular ligament of right ankle, initial encounter: Secondary | ICD-10-CM | POA: Diagnosis not present

## 2019-07-28 DIAGNOSIS — X500XXA Overexertion from strenuous movement or load, initial encounter: Secondary | ICD-10-CM | POA: Diagnosis not present

## 2019-07-28 DIAGNOSIS — S99811A Other specified injuries of right ankle, initial encounter: Secondary | ICD-10-CM | POA: Diagnosis present

## 2019-07-28 DIAGNOSIS — Y998 Other external cause status: Secondary | ICD-10-CM | POA: Insufficient documentation

## 2019-07-28 NOTE — ED Triage Notes (Signed)
Pt was playing at a park yesterday when she rolled her ankle. Since pain has got progressively worse and ankle is swollen. Pt taking ibuprofen today for pain.

## 2019-07-28 NOTE — ED Provider Notes (Signed)
[facility] EMERGENCY DEPARTMENT Provider Note   CSN: [id number] Arrival date & time: 07/28/19  1503     History   Chief Complaint Chief Complaint  Patient presents with   Ankle Pain    HPI Crystal Mccoy is a 38 y.o. female presents today for right ankle pain.  Patient reports that she was playing basketball yesterday and "rolled" her right ankle while playing.  She reports that she maintain her balance and did not fall to the ground.  She reports that she had immediate pain primarily to the lateral portion of her right ankle mild throbbing constant worsened with ambulation movement and improved with rest and ibuprofen.  She reports that swelling has increased since yesterday which prompted her ED visit today.  She denies any other injury today. Patient denies fever/chills, head injury, neck/back pain, chest/abdominal pain, numbness/tingling, weakness, wound/bleeding, or pain of any other joint.     HPI  Past Medical History:  Diagnosis Date   Anxiety    Depression    Diabetes mellitus without complication ([place])    Type II    Patient Active Problem List   Diagnosis Date Noted   Adenopathy 10/05/2018   Hip pain 09/28/2018    Past Surgical History:  Procedure Laterality Date   AXILLARY LYMPH NODE BIOPSY Left 10/22/2018   Procedure: AXILLARY LYMPH NODE BIOPSY;  Surgeon: Aviva Signs, MD;  Location: [facility];  Service: General;  Laterality: Left;   BIOPSY  11/07/2018   Procedure: BIOPSY;  Surgeon: Irving Copas., MD;  Location: [place];  Service: Endoscopy;;   CESAREAN SECTION     ESOPHAGOGASTRODUODENOSCOPY (EGD) WITH PROPOFOL N/A 11/07/2018   Procedure: ESOPHAGOGASTRODUODENOSCOPY (EGD) WITH PROPOFOL;  Surgeon: Irving Copas., MD;  Location: [place];  Service: Endoscopy;  Laterality: N/A;   ESOPHAGOGASTRODUODENOSCOPY (EGD) WITH PROPOFOL N/A 11/22/2018   Procedure: ESOPHAGOGASTRODUODENOSCOPY (EGD) WITH PROPOFOL;  Surgeon: Milus Banister,  MD;  Location: [facility];  Service: Endoscopy;  Laterality: N/A;   EUS N/A 11/22/2018   Procedure: UPPER ENDOSCOPIC ULTRASOUND (EUS) RADIAL;  Surgeon: Milus Banister, MD;  Location: [facility];  Service: Endoscopy;  Laterality: N/A;   FINE NEEDLE ASPIRATION N/A 11/22/2018   Procedure: FINE NEEDLE ASPIRATION (FNA) LINEAR;  Surgeon: Milus Banister, MD;  Location: [facility];  Service: Endoscopy;  Laterality: N/A;   TONSILLECTOMY       OB History    Gravida  2   Para  2   Term  2   Preterm      AB      Living        SAB      TAB      Ectopic      Multiple      Live Births               Home Medications    Prior to Admission medications   Medication Sig Start Date End Date Taking? Authorizing Provider  busPIRone (BUSPAR) 5 MG tablet Take 5 mg by mouth 2 (two) times daily.  12/27/18   [provider]  desvenlafaxine (PRISTIQ) 50 MG 24 hr tablet Take 100 mg by mouth daily.     [provider]  omeprazole (PRILOSEC) 40 MG capsule Take 40 mg by mouth daily.  12/31/18   [provider]  Semaglutide (OZEMPIC, 0.25 OR 0.5 MG/DOSE, [place]) Inject into the skin once a week.    [provider]  traZODone (DESYREL) 50 MG tablet Take 50 mg  by mouth at bedtime.  12/27/18   [provider]    Family History Family History  Problem Relation Age of Onset   Hypertension Father    Stroke Father    Cancer - Other Father    Seizures Father    Diabetes Father    Heart attack Father    Cancer Brother    Heart attack Paternal Uncle    Heart attack Paternal Grandfather     Social History Social History   Tobacco Use   Smoking status: Never Smoker   Smokeless tobacco: Never Used  Substance Use Topics   Alcohol use: Not Currently   Drug use: No     Allergies   Oxycodone-acetaminophen, Latex, Omniflox [temafloxacin], and Penicillins   Review of Systems Review of Systems Ten systems are reviewed and are  negative for acute change except as noted in the HPI  Physical Exam Updated Vital Signs BP 136/78    Pulse 94    Temp (!) 97.1 F (36.2 C) (Skin)    Resp 18    Ht 5\' 5"  (1.651 m)    Wt 89.8 kg    LMP 11/21/2018    SpO2 96%    BMI 32.95 kg/m   Physical Exam Constitutional:      General: She is not in acute distress.    Appearance: Normal appearance. She is well-developed. She is not ill-appearing or diaphoretic.  HENT:     Head: Normocephalic and atraumatic.     Right Ear: External ear normal.     Left Ear: External ear normal.     Nose: Nose normal.  Eyes:     General: Vision grossly intact. Gaze aligned appropriately.     Pupils: Pupils are equal, round, and reactive to light.  Neck:     Musculoskeletal: Normal range of motion.     Trachea: Trachea and phonation normal. No tracheal deviation.  Pulmonary:     Effort: Pulmonary effort is normal. No respiratory distress.  Abdominal:     General: There is no distension.     Palpations: Abdomen is soft.     Tenderness: There is no abdominal tenderness. There is no guarding or rebound.  Musculoskeletal: Normal range of motion.     Right hip: She exhibits normal range of motion and normal strength.     Right knee: Normal.     Right ankle: She exhibits swelling. She exhibits no ecchymosis and no deformity. Tenderness. Lateral malleolus tenderness found. Achilles tendon normal.     Left ankle: Normal.     Right lower leg: Normal.     Right foot: Normal.     Left foot: Normal.     Comments: Right ankle: Moderate amount of diffuse swelling, tenderness about the lateral malleolus.  Skin intact.  Capillary refill and sensation intact to all toes, pedal pulses intact and equal bilaterally, compartments soft.  Resisted range of motion intact with all movements.  Dorsiflexion, plantar flexion, inversion and eversion of the ankle is intact with increased pain.  Achilles tendon intact. - All other major joints of the lower extremities brought  through range of motion without pain.  Feet:     Right foot:     Protective Sensation: 5 sites tested. 5 sites sensed.     Left foot:     Protective Sensation: 5 sites tested. 5 sites sensed.  Skin:    General: Skin is warm and dry.  Neurological:     Mental Status: She is alert.  GCS: GCS eye subscore is 4. GCS verbal subscore is 5. GCS motor subscore is 6.     Comments: Speech is clear and goal oriented, follows commands Major Cranial nerves without deficit, no facial droop Moves extremities without ataxia, coordination intact  Psychiatric:        Behavior: Behavior normal.      ED Treatments / Results  Labs (all labs ordered are listed, but only abnormal results are displayed) Labs Reviewed - No data to display  EKG None  Radiology No results found.  Procedures Procedures (including critical care time)  Medications Ordered in ED Medications - No data to display   Initial Impression / Assessment and Plan / ED Course  I have reviewed the triage vital signs and the nursing notes.  Pertinent labs & imaging results that were available during my care of the patient were reviewed by me and considered in my medical decision making (see chart for details).    38 year old female presents today after rolling her ankle playing basketball yesterday.  She is neurovascularly intact to bilateral lower extremities without evidence of cellulitis, septic arthritis, DVT, compartment syndrome or gross ligamentous instability.  Capillary refill and sensation intact to all 10 toes, pedal pulses intact and equal bilaterally, compartments soft, skin intact.  She has mild swelling about the right ankle and tenderness to the right lateral malleolus consistent with likely ATFL sprain.  DG Right Ankle:  IMPRESSION:  No fracture or dislocation of the right ankle. Joint spaces are  preserved. Soft tissue edema about the ankle.  - Patient will be given cam walker and crutches today for  comfort, encouraged to use RICE therapy and OTC anti-inflammatories.  She will be given referral to orthopedist for follow-up.  Patient informed of limitations of x-rays and states understanding and plans to call orthopedist tomorrow morning, she is requesting [facility] as she has been seen before for other problems.  Patient given copy of x-ray reading including incidental finding of benign bone island and she will follow-up with ortho.  At this time there does not appear to be any evidence of an acute emergency medical condition and the patient appears stable for discharge with appropriate outpatient follow up. Diagnosis was discussed with patient who verbalizes understanding of care plan and is agreeable to discharge. I have discussed return precautions with patient and family who verbalizes understanding of return precautions. Patient encouraged to follow-up with their PCP and ortho. All questions answered.   Note: Portions of this report may have been transcribed using voice recognition software. Every effort was made to ensure accuracy; however, inadvertent computerized transcription errors may still be present. Final Clinical Impressions(s) / ED Diagnoses   Final diagnoses:  Sprain of anterior talofibular ligament of right ankle, initial encounter    ED Discharge Orders    None       Elizabeth PalauMorelli, Sara Keys A, PA-C 07/31/19 0701    Vanetta MuldersZackowski, Scott, MD 08/06/19 0830

## 2019-07-28 NOTE — Discharge Instructions (Addendum)
You have been diagnosed today with right ankle sprain.  At this time there does not appear to be the presence of an emergent medical condition, however there is always the potential for conditions to change. Please read and follow the below instructions.  Please return to the Emergency Department immediately for any new or worsening symptoms. Please be sure to follow up with your Primary Care Provider within one week regarding your visit today; please call their office to schedule an appointment even if you are feeling better for a follow-up visit. Please use the cam walker and crutches today to protect your right ankle from further injury.  Please use rest, ice and elevation to help with pain and swelling.  Please call the specialists at [facility] on your discharge paperwork for follow-up.  [facility] now [facility].  Below is the copy of your x-ray today. ---------------------------------- Dg Ankle Complete Right  Result Date: 07/28/2019 CLINICAL DATA:  Swelling, injury EXAM: RIGHT ANKLE - COMPLETE 3+ VIEW COMPARISON:  None. FINDINGS: No fracture or dislocation of the right ankle. Joint spaces are preserved. Soft tissue edema about the ankle. Incidental, benign bone island of the distal right tibial metadiaphysis. IMPRESSION: No fracture or dislocation of the right ankle. Joint spaces are preserved. Soft tissue edema about the ankle. --------------------- Unseen fractures may still be present despite xray today. Orthopedic follow-up is recommended.  As we discussed the ligaments and tendons are not present in x-rays and is likely that you have a sprain or strain today.  Please discuss the x-ray above including the incidental finding of a bony island with your orthopedist and PCP at your next visit.  Please read the additional information packets attached to your discharge summary.  Note: Portions of this text may have been transcribed using voice recognition  software. Every effort was made to ensure accuracy; however, inadvertent computerized transcription errors may still be present.

## 2020-04-20 IMAGING — PT NM PET TUM IMG INITIAL (PI) SKULL BASE T - THIGH
10 series · 24 of 25 positions shown · non-contrast
Comparison: CT 09/29/2018

CLINICAL DATA: Initial treatment strategy for lymphadenopathy.

EXAM:
NUCLEAR MEDICINE PET SKULL BASE TO THIGH
TECHNIQUE: 11.0 mCi F-18 FDG was injected intravenously. Full-ring PET imaging
was performed from the skull base to thigh after the radiotracer. CT
data was obtained and used for attenuation correction and anatomic
localization.
Fasting blood glucose: 136 mg/dl

[Series 3: ct wb 5.0 b30f · axial · 5.0mm · 0.98mm/px · z∈[-1152,-168]mm · 3 of 329 slices shown]
[im 1/329  brain]
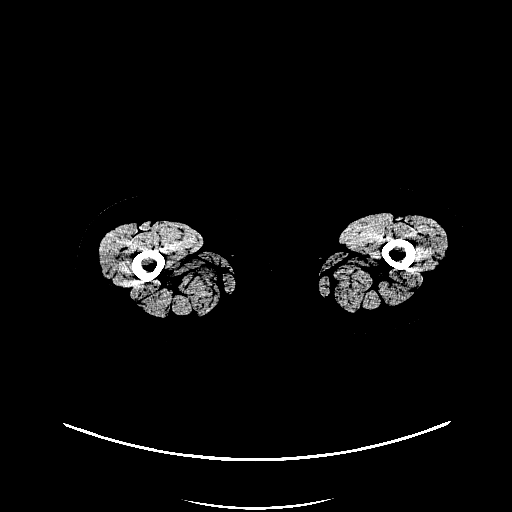
[im 165/329  brain]
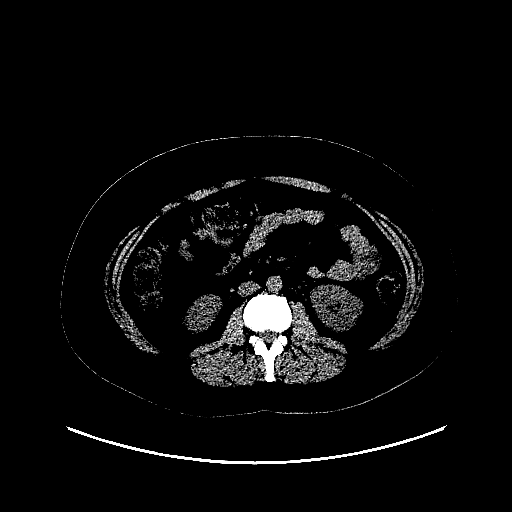
[im 329/329  brain]
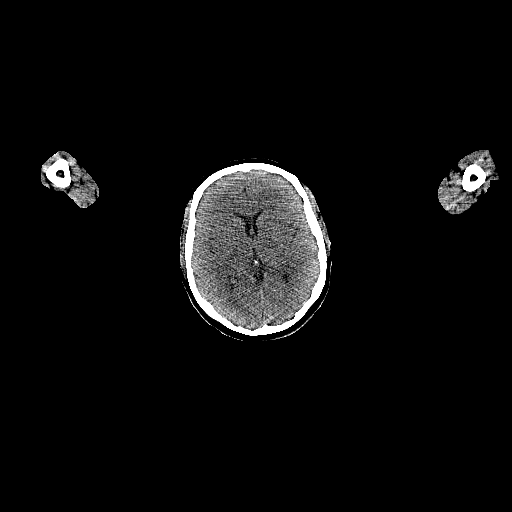

[Series 4: pet wb (ac) · axial · 5.0mm · 2.91mm/px · z∈[-1152,-168]mm · 3 of 329 slices shown]
[im 1/329]
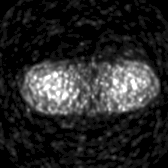
[im 165/329]
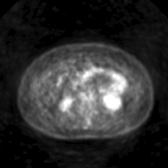
[im 329/329]
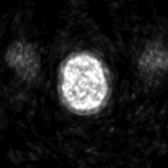

[Series 5: pet wb uncorrected (nac) · axial · 5.0mm · 4.07mm/px · z∈[-1152,-168]mm · 4 of 329 slices shown]
[im 1/329]
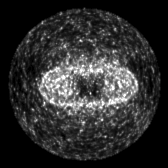
[im 110/329]
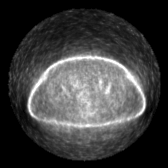
[im 219/329]
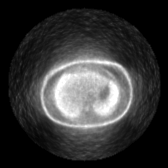
[im 329/329]
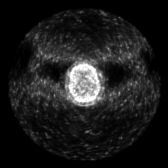

[Series 603: pet_ct axial fused · 3 of 327 slices shown]
[im 1/327]
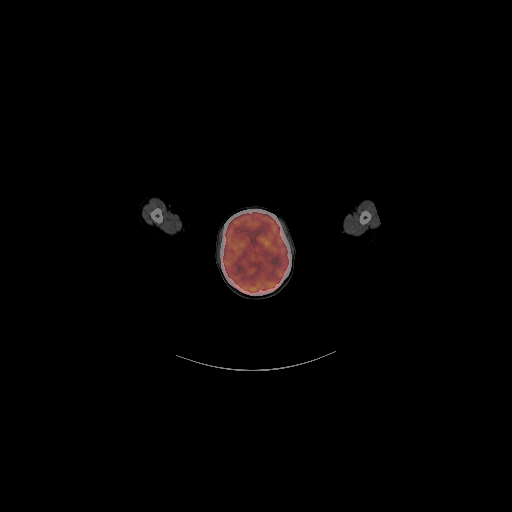
[im 109/327]
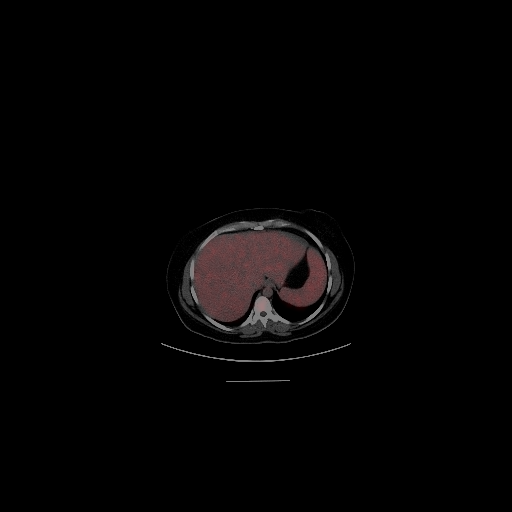
[im 327/327]
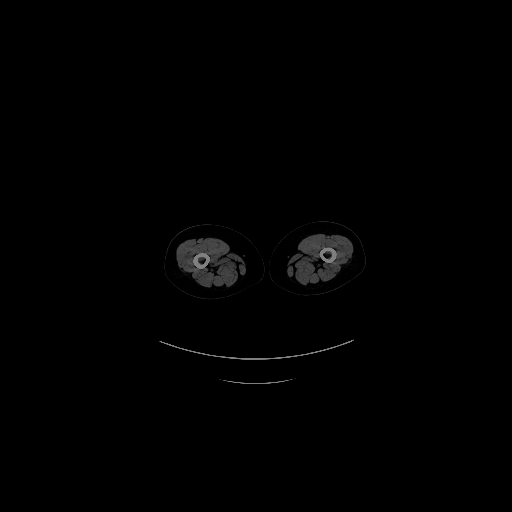

[Series 604: pet_ct coronal fused · 1 of 88 slices shown]
[im 1/88]
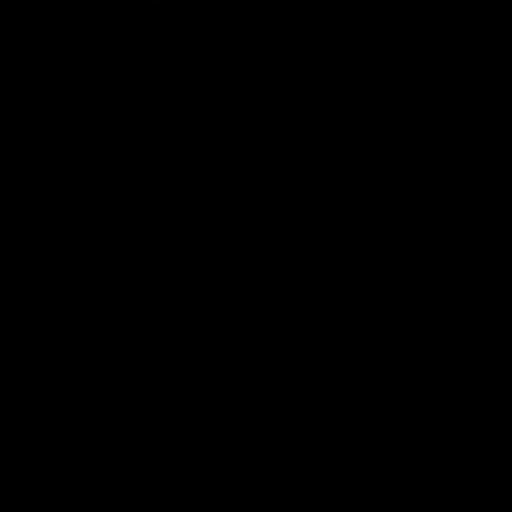

[Series 605: pet_ct sagittal fused · 2 of 159 slices shown]
[im 1/159]
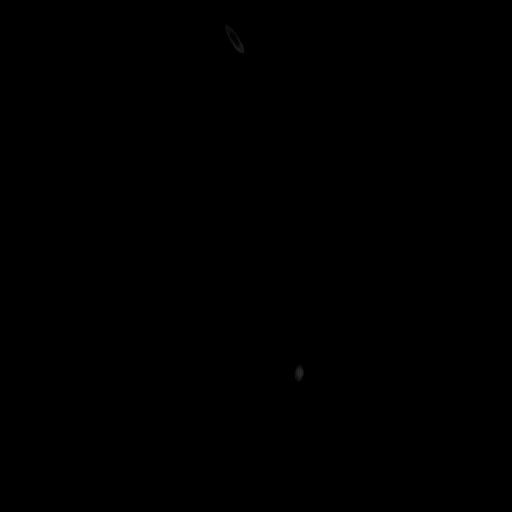
[im 159/159]
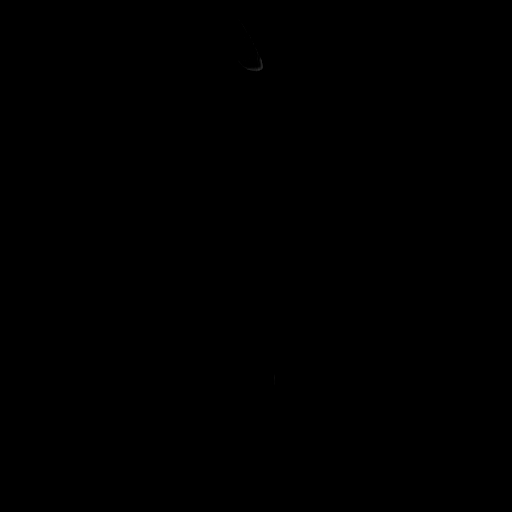

[Series 606: pet axial · 4 of 321 slices shown]
[im 1/321]
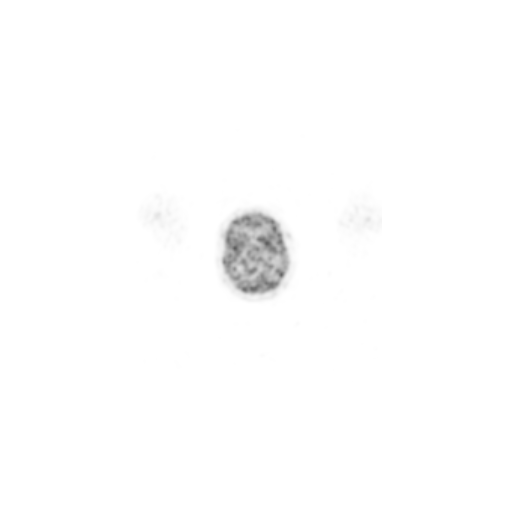
[im 107/321]
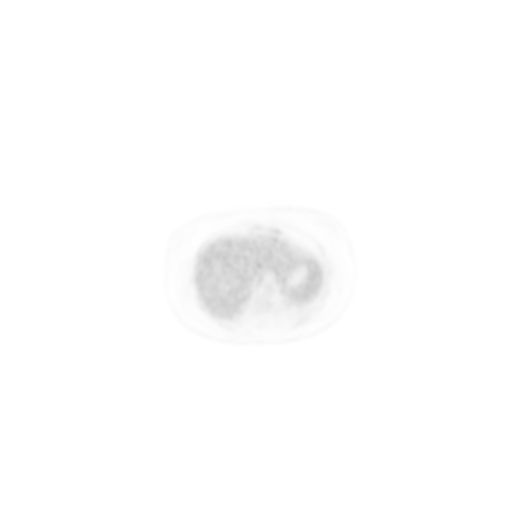
[im 214/321]
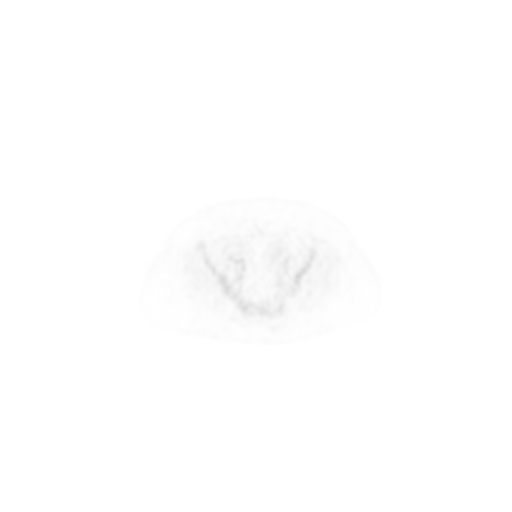
[im 321/321]
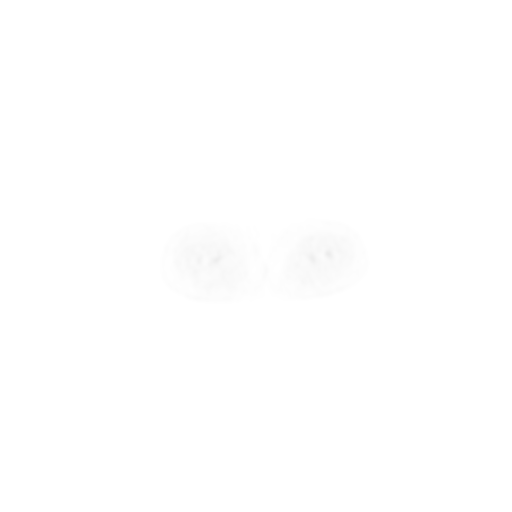

[Series 607: pet coronal · 1 of 105 slices shown]
[im 1/105]
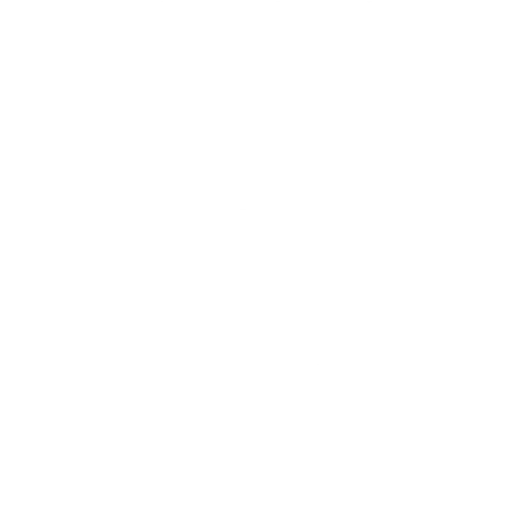

[Series 608: pet sagittal · 2 of 162 slices shown]
[im 1/162]
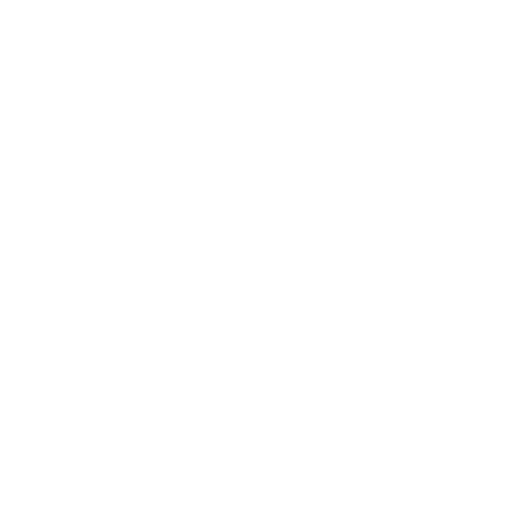
[im 162/162]
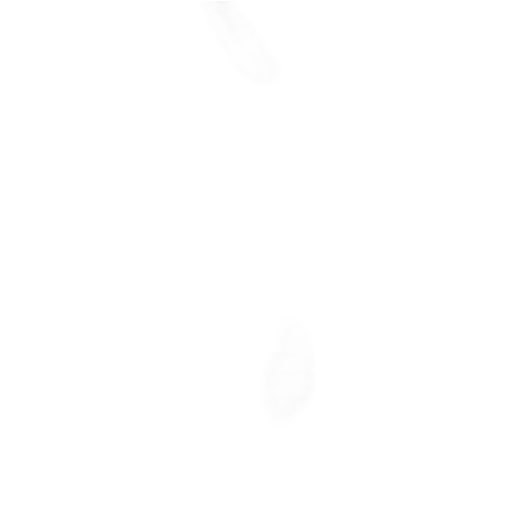

[Series 1069: results mm oncology reading · 1.0mm · 0.89mm/px · 1 of 11 slices shown]
[im 1/11]
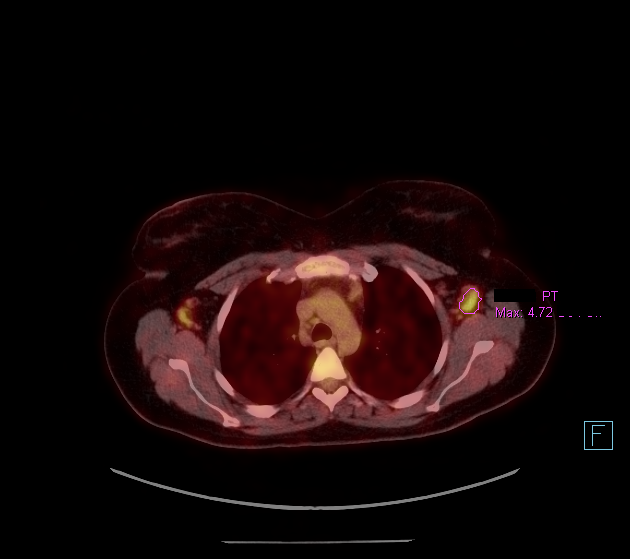

[24 of 25 positions shown; findings below may reference images not displayed]

FINDINGS: Mediastinal blood pool activity: SUV max

NECK: Bilateral hypermetabolic level 2 lymph nodes which are only
mildly enlarged but numerous. For example RIGHT level II lymph node
anterior to the sternocleidomastoid muscle measures 9 mm (image
36/3) with SUV max equal 4.3.

Bilateral level V lymph nodes which are mildly enlarged and
moderately hypermetabolic. For example node on the LEFT measuring 9
mm short axis (image 49/3) SUV max equal 3.4.

Incidental CT findings: None

CHEST: Bilateral mildly hypermetabolic axillary lymph nodes. For
example LEFT axillary lymph node measuring 9 mm short axis with SUV
max equal 4.7.

Ill-defined tissue in the anterior mediastinum on the LEFT with
moderate metabolic activity. Minimal mediastinal adenopathy. No
suspicious pulmonary nodules.

Incidental CT findings: none

ABDOMEN/PELVIS: Spleen is enlarged and with moderate hypermetabolic
activity greater than liver which is favored abnormal. Spleen
measures 13.1 by 5.9 by 12.8 cm (volume = 520 cm^3).

There is a cluster of hypermetabolic lymph nodes in the porta
hepatis. Lymph nodes measure 15 mm short axis with SUV max equal
7.6.

Mildly hypermetabolic external iliac lymph nodes and inguinal nodes.
Example RIGHT inguinal lymph node with SUV max equal 2.7.

No abnormal activity in liver. Liver does appear mildly enlarged. No
significant periaortic adenopathy

Uterus and ovaries normal.

Incidental CT findings: none

SKELETON: No focal hypermetabolic activity to suggest skeletal
metastasis.

Incidental CT findings: none
IMPRESSION: 1. Findings consistent with lymphoproliferative process such as CLL
or lymphoma.
2. Mildly hypermetabolic cervical lymph nodes, axial lymph nodes,
mediastinal nodes and periportal lymph nodes.
3. Enlarged hypermetabolic spleen.
4. Lymph node with highest potential diagnostic yield for biopsy
would be the LEFT axillary lymph node. Most intense hypermetabolic
nodes on the scan are in the porta hepatis.

## 2022-10-26 ENCOUNTER — Emergency Department (HOSPITAL_COMMUNITY)
Admission: EM | Admit: 2022-10-26 | Discharge: 2022-10-26 | Disposition: A | Discharge status: Home / Self Care | Payer: No Typology Code available for payment source | Attending: Emergency Medicine | Admitting: Emergency Medicine

## 2022-10-26 ENCOUNTER — Other Ambulatory Visit: Payer: Self-pay

## 2022-10-26 ENCOUNTER — Encounter (HOSPITAL_COMMUNITY): Payer: Self-pay | Admitting: Emergency Medicine

## 2022-10-26 DIAGNOSIS — Z91040 Latex allergy status: Secondary | ICD-10-CM | POA: Insufficient documentation

## 2022-10-26 DIAGNOSIS — Z794 Long term (current) use of insulin: Secondary | ICD-10-CM | POA: Insufficient documentation

## 2022-10-26 DIAGNOSIS — E119 Type 2 diabetes mellitus without complications: Secondary | ICD-10-CM | POA: Insufficient documentation

## 2022-10-26 DIAGNOSIS — R112 Nausea with vomiting, unspecified: Secondary | ICD-10-CM | POA: Insufficient documentation

## 2022-10-26 LAB — URINALYSIS, ROUTINE W REFLEX MICROSCOPIC
Bilirubin Urine: NEGATIVE
Glucose, UA: NEGATIVE mg/dL
Hgb urine dipstick: NEGATIVE
Ketones, ur: NEGATIVE mg/dL
Leukocytes,Ua: NEGATIVE
Nitrite: NEGATIVE
Protein, ur: 30 mg/dL — AB
Specific Gravity, Urine: 1.027 (ref 1.005–1.030)
pH: 5 (ref 5.0–8.0)

## 2022-10-26 LAB — CBC
HCT: 44.3 % (ref 36.0–46.0)
Hemoglobin: 14.9 g/dL (ref 12.0–15.0)
MCH: 31.4 pg (ref 26.0–34.0)
MCHC: 33.6 g/dL (ref 30.0–36.0)
MCV: 93.3 fL (ref 80.0–100.0)
Platelets: 298 10*3/uL (ref 150–400)
RBC: 4.75 MIL/uL (ref 3.87–5.11)
RDW: 11.9 % (ref 11.5–15.5)
WBC: 6.3 10*3/uL (ref 4.0–10.5)
nRBC: 0 % (ref 0.0–0.2)

## 2022-10-26 LAB — COMPREHENSIVE METABOLIC PANEL
ALT: 21 U/L (ref 0–44)
AST: 18 U/L (ref 15–41)
Albumin: 4.3 g/dL (ref 3.5–5.0)
Alkaline Phosphatase: 57 U/L (ref 38–126)
Anion gap: 8 (ref 5–15)
BUN: 11 mg/dL (ref 6–20)
CO2: 25 mmol/L (ref 22–32)
Calcium: 9.1 mg/dL (ref 8.9–10.3)
Chloride: 104 mmol/L (ref 98–111)
Creatinine, Ser: 0.66 mg/dL (ref 0.44–1.00)
GFR, Estimated: >60 mL/min (ref >60)
Glucose, Bld: 115 mg/dL — ABNORMAL HIGH (ref 70–99)
Potassium: 4.5 mmol/L (ref 3.5–5.1)
Sodium: 137 mmol/L (ref 135–145)
Total Bilirubin: 0.9 mg/dL (ref 0.3–1.2)
Total Protein: 7.7 g/dL (ref 6.5–8.1)

## 2022-10-26 LAB — HCG, QUANTITATIVE, PREGNANCY: hCG, Beta Chain, Quant, S: 1 m[IU]/mL (ref <5)

## 2022-10-26 LAB — LIPASE, BLOOD: Lipase: 38 U/L (ref 11–51)

## 2022-10-26 MED ORDER — SODIUM CHLORIDE 0.9 % IV BOLUS
1000.0000 mL | Freq: Once | INTRAVENOUS | Status: AC
  Administered 2022-10-26: 1000 mL via INTRAVENOUS

## 2022-10-26 MED ORDER — PANTOPRAZOLE SODIUM 20 MG PO TBEC: 20.0000 mg | DELAYED_RELEASE_TABLET | Freq: Every day | ORAL | 0 refills | Status: DC

## 2022-10-26 MED ORDER — ONDANSETRON 4 MG PO TBDP
4.0000 mg | ORAL_TABLET | Freq: Once | ORAL | Status: AC | PRN
  Administered 2022-10-26: 4 mg via ORAL
  Filled 2022-10-26: qty 1

## 2022-10-26 MED ORDER — ONDANSETRON HCL 4 MG/2ML IJ SOLN
4.0000 mg | Freq: Once | INTRAMUSCULAR | Status: AC
  Administered 2022-10-26: 4 mg via INTRAVENOUS
  Filled 2022-10-26: qty 2

## 2022-10-26 MED ORDER — PANTOPRAZOLE SODIUM 40 MG IV SOLR
40.0000 mg | Freq: Once | INTRAVENOUS | Status: AC
  Administered 2022-10-26: 40 mg via INTRAVENOUS
  Filled 2022-10-26: qty 10

## 2022-10-26 NOTE — ED Notes (Signed)
Pt d/c home per MD order. Discharge summary reviewed, pt verbalizes understanding. No s/s of acute distress noted at discharge.

## 2022-10-26 NOTE — ED Triage Notes (Signed)
Patient c/o nausea and vomiting x3 days. Denies any fever, diarrhea, or urinary. Patient reports taking Zofran and Phenergan yesterday. Per patient no relief with Zofran and only fell asleep with Phenergan. Per patient now has headache, fatigue, and dizziness with position changes. Vomited x1 in past 24 hours, but constant nausea.

## 2022-10-26 NOTE — Discharge Instructions (Signed)
Continue to use your nausea medicine.  Drink plenty of fluids.  Return if not improving.  Follow-up with your family doctor later this week for recheck

## 2022-10-26 NOTE — ED Provider Notes (Signed)
[facility] Provider Note   CSN: [id number] Arrival date & time: 10/26/22  0800     History  Chief Complaint  Patient presents with   Vomiting    Crystal Mccoy is a 42 y.o. female.  Patient has a history of diabetes.  She has been vomiting for couple days.  She has no abdominal discomfort  The history is provided by the patient and medical records. No language interpreter was used.  Emesis Severity:  Moderate Timing:  Constant Quality:  Stomach contents Able to tolerate:  Liquids Progression:  Improving Chronicity:  New Recent urination:  Normal Relieved by:  Nothing Worsened by:  Nothing Ineffective treatments:  None tried Associated symptoms: no cough, no diarrhea and no headaches   Risk factors: no alcohol use        Home Medications Prior to Admission medications   Medication Sig Start Date End Date Taking? Authorizing Provider  pantoprazole (PROTONIX) 20 MG tablet Take 1 tablet (20 mg total) by mouth daily. 10/26/22  Yes Milton Ferguson, MD  busPIRone (BUSPAR) 5 MG tablet Take 5 mg by mouth 2 (two) times daily.  12/27/18   [provider]  desvenlafaxine (PRISTIQ) 50 MG 24 hr tablet Take 100 mg by mouth daily.     [provider]  omeprazole (PRILOSEC) 40 MG capsule Take 40 mg by mouth daily.  12/31/18   [provider]  Semaglutide (OZEMPIC, 0.25 OR 0.5 MG/DOSE, [place]) Inject into the skin once a week.    [provider]  traZODone (DESYREL) 50 MG tablet Take 50 mg by mouth at bedtime.  12/27/18   [provider]      Allergies    Oxycodone-acetaminophen, Latex, Omniflox [temafloxacin], and Penicillins    Review of Systems   Review of Systems  Constitutional:  Negative for appetite change and fatigue.  HENT:  Negative for congestion, ear discharge and sinus pressure.   Eyes:  Negative for discharge.  Respiratory:  Negative for cough.   Cardiovascular:  Negative for chest pain.   Gastrointestinal:  Positive for nausea and vomiting. Negative for diarrhea.  Genitourinary:  Negative for frequency and hematuria.  Musculoskeletal:  Negative for back pain.  Skin:  Negative for rash.  Neurological:  Negative for seizures and headaches.  Psychiatric/Behavioral:  Negative for hallucinations.     Physical Exam Updated Vital Signs BP 114/60   Pulse (!) 58   Temp 98.1 F (36.7 C) (Oral)   Resp 16   Ht 5\' 5"  (1.651 m)   Wt 85.3 kg   LMP 11/21/2018   SpO2 99%   BMI 31.28 kg/m  Physical Exam Vitals and nursing note reviewed.  Constitutional:      Appearance: She is well-developed.  HENT:     Head: Normocephalic.     Mouth/Throat:     Mouth: Mucous membranes are moist.  Eyes:     General: No scleral icterus.    Conjunctiva/sclera: Conjunctivae normal.  Neck:     Thyroid: No thyromegaly.  Cardiovascular:     Rate and Rhythm: Normal rate and regular rhythm.     Heart sounds: No murmur heard.    No friction rub. No gallop.  Pulmonary:     Breath sounds: No stridor. No wheezing or rales.  Chest:     Chest wall: No tenderness.  Abdominal:     General: There is no distension.     Tenderness: There is no abdominal tenderness. There is no  rebound.  Musculoskeletal:        General: Normal range of motion.     Cervical back: Neck supple.  Lymphadenopathy:     Cervical: No cervical adenopathy.  Skin:    Findings: No erythema or rash.  Neurological:     Mental Status: She is alert and oriented to person, place, and time.     Motor: No abnormal muscle tone.     Coordination: Coordination normal.  Psychiatric:        Behavior: Behavior normal.     ED Results / Procedures / Treatments   Labs (all labs ordered are listed, but only abnormal results are displayed) Labs Reviewed  COMPREHENSIVE METABOLIC PANEL - Abnormal; Notable for the following components:      Result Value   Glucose, Bld 115 (*)    All other components within normal limits  URINALYSIS,  ROUTINE W REFLEX MICROSCOPIC - Abnormal; Notable for the following components:   APPearance HAZY (*)    Protein, ur 30 (*)    Bacteria, UA FEW (*)    All other components within normal limits  LIPASE, BLOOD  CBC  HCG, QUANTITATIVE, PREGNANCY    EKG None  Radiology No results found.  Procedures Procedures    Medications Ordered in ED Medications  ondansetron (ZOFRAN-ODT) disintegrating tablet 4 mg (4 mg Oral Given 10/26/22 0835)  sodium chloride 0.9 % bolus 1,000 mL (1,000 mLs Intravenous Bolus 10/26/22 0923)  ondansetron (ZOFRAN) injection 4 mg (4 mg Intravenous Given 10/26/22 0924)  pantoprazole (PROTONIX) injection 40 mg (40 mg Intravenous Given 10/26/22 6606)    ED Course/ Medical Decision Making/ A&P                             Medical Decision Making Amount and/or Complexity of Data Reviewed Labs: ordered.  Risk Prescription drug management.  This patient presents to the ED for concern of vomiting, this involves an extensive number of treatment options, and is a complaint that carries with it a high risk of complications and morbidity.  The differential diagnosis includes gastritis, gastroparesis, DKA   Co morbidities that complicate the patient evaluation  Diabetes   Additional history obtained:  Additional history obtained from patient External records from outside source obtained and reviewed including hospital records   Lab Tests:  I Ordered, and personally interpreted labs.  The pertinent results include: Chemistries normal except glucose 115   Imaging Studies ordered:  No imaging  Cardiac Monitoring: / EKG:  The patient was maintained on a cardiac monitor.  I personally viewed and interpreted the cardiac monitored which showed an underlying rhythm of: Normal sinus rhythm   Consultations Obtained:  No consultant Problem List / ED Course / Critical interventions / Medication management  Diabetes and vomiting I ordered medication including  normal saline for dehydration and Zofran for nausea Reevaluation of the patient after these medicines showed that the patient improved I have reviewed the patients home medicines and have made adjustments as needed   Social Determinants of Health:  None   Test / Admission - Considered:  None  Patient with vomiting and dehydration but not DKA.  She improved with IV fluids and nausea medicine.  She will be discharged home and continue her nausea medicine and given Protonix and will follow-up with the PCP in a couple days        Final Clinical Impression(s) / ED Diagnoses Final diagnoses:  Nausea and vomiting, unspecified vomiting  type    Rx / DC Orders ED Discharge Orders          Ordered    pantoprazole (PROTONIX) 20 MG tablet  Daily        10/26/22 1102              Milton Ferguson, MD 10/26/22 1646

## 2023-02-14 DIAGNOSIS — Z794 Long term (current) use of insulin: Secondary | ICD-10-CM | POA: Diagnosis not present

## 2023-02-14 DIAGNOSIS — E669 Obesity, unspecified: Secondary | ICD-10-CM | POA: Diagnosis not present

## 2023-02-14 DIAGNOSIS — F410 Panic disorder [episodic paroxysmal anxiety] without agoraphobia: Secondary | ICD-10-CM | POA: Diagnosis not present

## 2023-02-14 DIAGNOSIS — G4700 Insomnia, unspecified: Secondary | ICD-10-CM | POA: Diagnosis not present

## 2023-02-14 DIAGNOSIS — Z79899 Other long term (current) drug therapy: Secondary | ICD-10-CM | POA: Diagnosis not present

## 2023-02-14 DIAGNOSIS — F329 Major depressive disorder, single episode, unspecified: Secondary | ICD-10-CM | POA: Diagnosis not present

## 2023-02-14 DIAGNOSIS — E509 Vitamin A deficiency, unspecified: Secondary | ICD-10-CM | POA: Diagnosis not present

## 2023-02-14 DIAGNOSIS — E282 Polycystic ovarian syndrome: Secondary | ICD-10-CM | POA: Diagnosis not present

## 2023-02-14 DIAGNOSIS — F419 Anxiety disorder, unspecified: Secondary | ICD-10-CM | POA: Diagnosis not present

## 2023-02-14 DIAGNOSIS — E119 Type 2 diabetes mellitus without complications: Secondary | ICD-10-CM | POA: Diagnosis not present

## 2023-02-21 DIAGNOSIS — F4312 Post-traumatic stress disorder, chronic: Secondary | ICD-10-CM | POA: Diagnosis not present

## 2023-02-21 DIAGNOSIS — F332 Major depressive disorder, recurrent severe without psychotic features: Secondary | ICD-10-CM | POA: Diagnosis not present

## 2023-03-10 DIAGNOSIS — F332 Major depressive disorder, recurrent severe without psychotic features: Secondary | ICD-10-CM | POA: Diagnosis not present

## 2023-03-10 DIAGNOSIS — F4312 Post-traumatic stress disorder, chronic: Secondary | ICD-10-CM | POA: Diagnosis not present

## 2023-03-17 DIAGNOSIS — F4312 Post-traumatic stress disorder, chronic: Secondary | ICD-10-CM | POA: Diagnosis not present

## 2023-03-17 DIAGNOSIS — F332 Major depressive disorder, recurrent severe without psychotic features: Secondary | ICD-10-CM | POA: Diagnosis not present

## 2023-03-22 DIAGNOSIS — F332 Major depressive disorder, recurrent severe without psychotic features: Secondary | ICD-10-CM | POA: Diagnosis not present

## 2023-03-22 DIAGNOSIS — F4312 Post-traumatic stress disorder, chronic: Secondary | ICD-10-CM | POA: Diagnosis not present

## 2023-03-31 DIAGNOSIS — F332 Major depressive disorder, recurrent severe without psychotic features: Secondary | ICD-10-CM | POA: Diagnosis not present

## 2023-03-31 DIAGNOSIS — F4312 Post-traumatic stress disorder, chronic: Secondary | ICD-10-CM | POA: Diagnosis not present

## 2023-04-07 DIAGNOSIS — F332 Major depressive disorder, recurrent severe without psychotic features: Secondary | ICD-10-CM | POA: Diagnosis not present

## 2023-04-07 DIAGNOSIS — F4312 Post-traumatic stress disorder, chronic: Secondary | ICD-10-CM | POA: Diagnosis not present

## 2023-04-21 DIAGNOSIS — F332 Major depressive disorder, recurrent severe without psychotic features: Secondary | ICD-10-CM | POA: Diagnosis not present

## 2023-04-21 DIAGNOSIS — F4312 Post-traumatic stress disorder, chronic: Secondary | ICD-10-CM | POA: Diagnosis not present

## 2023-05-09 ENCOUNTER — Emergency Department (HOSPITAL_COMMUNITY)
Admission: EM | Admit: 2023-05-09 | Discharge: 2023-05-09 | Disposition: A | Discharge status: Home / Self Care | Payer: Commercial Managed Care - PPO | Attending: Emergency Medicine | Admitting: Emergency Medicine

## 2023-05-09 ENCOUNTER — Emergency Department (HOSPITAL_COMMUNITY): Payer: Commercial Managed Care - PPO

## 2023-05-09 ENCOUNTER — Other Ambulatory Visit: Payer: Self-pay

## 2023-05-09 DIAGNOSIS — S8001XA Contusion of right knee, initial encounter: Secondary | ICD-10-CM | POA: Diagnosis not present

## 2023-05-09 DIAGNOSIS — M25561 Pain in right knee: Secondary | ICD-10-CM | POA: Diagnosis not present

## 2023-05-09 DIAGNOSIS — S60221A Contusion of right hand, initial encounter: Secondary | ICD-10-CM | POA: Diagnosis not present

## 2023-05-09 DIAGNOSIS — M79641 Pain in right hand: Secondary | ICD-10-CM | POA: Diagnosis not present

## 2023-05-09 DIAGNOSIS — Z91040 Latex allergy status: Secondary | ICD-10-CM | POA: Diagnosis not present

## 2023-05-09 DIAGNOSIS — W1830XA Fall on same level, unspecified, initial encounter: Secondary | ICD-10-CM | POA: Diagnosis not present

## 2023-05-09 DIAGNOSIS — W19XXXA Unspecified fall, initial encounter: Secondary | ICD-10-CM

## 2023-05-09 MED ORDER — IBUPROFEN 800 MG PO TABS: 800.0000 mg | ORAL_TABLET | Freq: Three times a day (TID) | ORAL | 1 refills | Status: DC | PRN

## 2023-05-09 NOTE — Discharge Instructions (Signed)
Follow-up with Dr.Cairns or one of his colleagues if not improving

## 2023-05-09 NOTE — ED Provider Notes (Signed)
[facility] Provider Note   CSN: [id number] Arrival date & time: 05/09/23  6578     History {Add pertinent medical, surgical, social history, OB history to HPI:1} Chief Complaint  Patient presents with   Crystal Mccoy is a 42 y.o. female.  Patient fell and hurt her right knee and right hand.  Patient has a history of GERD   Fall       Home Medications Prior to Admission medications   Medication Sig Start Date End Date Taking? Authorizing Provider  ibuprofen (ADVIL) 800 MG tablet Take 1 tablet (800 mg total) by mouth every 8 (eight) hours as needed for moderate pain. 05/09/23  Yes Bethann Berkshire, MD  busPIRone (BUSPAR) 5 MG tablet Take 5 mg by mouth 2 (two) times daily.  12/27/18   [provider]  desvenlafaxine (PRISTIQ) 50 MG 24 hr tablet Take 100 mg by mouth daily.     [provider]  omeprazole (PRILOSEC) 40 MG capsule Take 40 mg by mouth daily.  12/31/18   [provider]  pantoprazole (PROTONIX) 20 MG tablet Take 1 tablet (20 mg total) by mouth daily. 10/26/22   Bethann Berkshire, MD  Semaglutide (OZEMPIC, 0.25 OR 0.5 MG/DOSE, [place]) Inject into the skin once a week.    [provider]  traZODone (DESYREL) 50 MG tablet Take 50 mg by mouth at bedtime.  12/27/18   [provider]      Allergies    Oxycodone-acetaminophen, Latex, Omniflox [temafloxacin], and Penicillins    Review of Systems   Review of Systems  Physical Exam Updated Vital Signs BP 118/82   Pulse 62   Temp 97.6 F (36.4 C) (Oral)   Resp 18   Ht 5\' 5"  (1.651 m)   Wt 89.8 kg   LMP 11/21/2018   SpO2 97%   BMI 32.95 kg/m  Physical Exam  ED Results / Procedures / Treatments   Labs (all labs ordered are listed, but only abnormal results are displayed) Labs Reviewed - No data to display  EKG None  Radiology DG Knee Complete 4 Views Right  Result Date: 05/09/2023 CLINICAL DATA:  Right knee pain after  fall. EXAM: RIGHT KNEE - COMPLETE 4+ VIEW COMPARISON:  None Available. FINDINGS: No evidence of fracture, dislocation, or joint effusion. No evidence of arthropathy or other focal bone abnormality. Soft tissues are unremarkable. IMPRESSION: Negative. Electronically Signed   By: Lupita Raider M.D.   On: 05/09/2023 08:13   DG Hand Complete Right  Result Date: 05/09/2023 CLINICAL DATA:  Right hand pain after fall last night. EXAM: RIGHT HAND - COMPLETE 3+ VIEW COMPARISON:  None Available. FINDINGS: There is no evidence of fracture or dislocation. There is no evidence of arthropathy or other focal bone abnormality. Soft tissues are unremarkable. IMPRESSION: Negative. Electronically Signed   By: Lupita Raider M.D.   On: 05/09/2023 08:09    Procedures Procedures  {Document cardiac monitor, telemetry assessment procedure when appropriate:1}  Medications Ordered in ED Medications - No data to display  ED Course/ Medical Decision Making/ A&P   {   Click here for ABCD2, HEART and other calculatorsREFRESH Note before signing :1}                              Medical Decision Making Amount and/or Complexity of Data Reviewed Radiology: ordered.  Risk Prescription drug management.  Patient with contusion to right hand and right knee.  Patient is given Motrin and will follow-up with orthopedics if needed  {Document critical care time when appropriate:1} {Document review of labs and clinical decision tools ie heart score, Chads2Vasc2 etc:1}  {Document your independent review of radiology images, and any outside records:1} {Document your discussion with family members, caretakers, and with consultants:1} {Document social determinants of health affecting pt's care:1} {Document your decision making why or why not admission, treatments were needed:1} Final Clinical Impression(s) / ED Diagnoses Final diagnoses:  Fall, initial encounter  Contusion of right knee, initial encounter  Contusion of  right hand, initial encounter    Rx / DC Orders ED Discharge Orders          Ordered    ibuprofen (ADVIL) 800 MG tablet  Every 8 hours PRN        05/09/23 0924

## 2023-05-09 NOTE — ED Triage Notes (Signed)
Pt states she fell last night after losing her balance landing on her knees. C/o right knee and right hand pain. Denies LOC.

## 2023-05-11 DIAGNOSIS — F4312 Post-traumatic stress disorder, chronic: Secondary | ICD-10-CM | POA: Diagnosis not present

## 2023-05-11 DIAGNOSIS — F332 Major depressive disorder, recurrent severe without psychotic features: Secondary | ICD-10-CM | POA: Diagnosis not present

## 2023-05-16 DIAGNOSIS — F4312 Post-traumatic stress disorder, chronic: Secondary | ICD-10-CM | POA: Diagnosis not present

## 2023-05-16 DIAGNOSIS — F332 Major depressive disorder, recurrent severe without psychotic features: Secondary | ICD-10-CM | POA: Diagnosis not present

## 2023-05-17 ENCOUNTER — Other Ambulatory Visit: Payer: Self-pay

## 2023-05-17 MED ORDER — OZEMPIC (0.25 OR 0.5 MG/DOSE) 2 MG/3ML ~~LOC~~ SOPN
0.5000 mg | PEN_INJECTOR | SUBCUTANEOUS | 3 refills | Status: DC
  Filled 2023-05-17: qty 3, 28d supply, fill #0
  Filled 2023-06-10: qty 3, 28d supply, fill #1
  Filled 2023-07-11: qty 3, 28d supply, fill #2
  Filled 2023-08-09: qty 3, 28d supply, fill #3
  Filled 2023-09-04: qty 3, 28d supply, fill #4
  Filled 2023-10-04: qty 3, 28d supply, fill #5

## 2023-05-23 ENCOUNTER — Other Ambulatory Visit: Payer: Self-pay

## 2023-05-24 DIAGNOSIS — F332 Major depressive disorder, recurrent severe without psychotic features: Secondary | ICD-10-CM | POA: Diagnosis not present

## 2023-05-24 DIAGNOSIS — F4312 Post-traumatic stress disorder, chronic: Secondary | ICD-10-CM | POA: Diagnosis not present

## 2023-06-07 DIAGNOSIS — F332 Major depressive disorder, recurrent severe without psychotic features: Secondary | ICD-10-CM | POA: Diagnosis not present

## 2023-06-07 DIAGNOSIS — F4312 Post-traumatic stress disorder, chronic: Secondary | ICD-10-CM | POA: Diagnosis not present

## 2023-06-12 DIAGNOSIS — F332 Major depressive disorder, recurrent severe without psychotic features: Secondary | ICD-10-CM | POA: Diagnosis not present

## 2023-06-12 DIAGNOSIS — F4312 Post-traumatic stress disorder, chronic: Secondary | ICD-10-CM | POA: Diagnosis not present

## 2023-06-23 DIAGNOSIS — F4312 Post-traumatic stress disorder, chronic: Secondary | ICD-10-CM | POA: Diagnosis not present

## 2023-06-23 DIAGNOSIS — F332 Major depressive disorder, recurrent severe without psychotic features: Secondary | ICD-10-CM | POA: Diagnosis not present

## 2023-06-28 DIAGNOSIS — F4312 Post-traumatic stress disorder, chronic: Secondary | ICD-10-CM | POA: Diagnosis not present

## 2023-06-28 DIAGNOSIS — F332 Major depressive disorder, recurrent severe without psychotic features: Secondary | ICD-10-CM | POA: Diagnosis not present

## 2023-07-03 DIAGNOSIS — F4312 Post-traumatic stress disorder, chronic: Secondary | ICD-10-CM | POA: Diagnosis not present

## 2023-07-03 DIAGNOSIS — F332 Major depressive disorder, recurrent severe without psychotic features: Secondary | ICD-10-CM | POA: Diagnosis not present

## 2023-07-14 DIAGNOSIS — F332 Major depressive disorder, recurrent severe without psychotic features: Secondary | ICD-10-CM | POA: Diagnosis not present

## 2023-07-14 DIAGNOSIS — F4312 Post-traumatic stress disorder, chronic: Secondary | ICD-10-CM | POA: Diagnosis not present

## 2023-07-19 DIAGNOSIS — F4312 Post-traumatic stress disorder, chronic: Secondary | ICD-10-CM | POA: Diagnosis not present

## 2023-07-19 DIAGNOSIS — F332 Major depressive disorder, recurrent severe without psychotic features: Secondary | ICD-10-CM | POA: Diagnosis not present

## 2023-07-25 DIAGNOSIS — F332 Major depressive disorder, recurrent severe without psychotic features: Secondary | ICD-10-CM | POA: Diagnosis not present

## 2023-07-25 DIAGNOSIS — F4312 Post-traumatic stress disorder, chronic: Secondary | ICD-10-CM | POA: Diagnosis not present

## 2023-08-10 DIAGNOSIS — F332 Major depressive disorder, recurrent severe without psychotic features: Secondary | ICD-10-CM | POA: Diagnosis not present

## 2023-08-10 DIAGNOSIS — F4312 Post-traumatic stress disorder, chronic: Secondary | ICD-10-CM | POA: Diagnosis not present

## 2023-08-23 DIAGNOSIS — J029 Acute pharyngitis, unspecified: Secondary | ICD-10-CM | POA: Diagnosis not present

## 2023-08-23 DIAGNOSIS — J039 Acute tonsillitis, unspecified: Secondary | ICD-10-CM | POA: Diagnosis not present

## 2023-08-23 DIAGNOSIS — J020 Streptococcal pharyngitis: Secondary | ICD-10-CM | POA: Diagnosis not present

## 2023-08-23 DIAGNOSIS — U071 COVID-19: Secondary | ICD-10-CM | POA: Diagnosis not present

## 2023-08-23 DIAGNOSIS — J36 Peritonsillar abscess: Secondary | ICD-10-CM | POA: Diagnosis not present

## 2023-08-26 DIAGNOSIS — E663 Overweight: Secondary | ICD-10-CM | POA: Diagnosis not present

## 2023-08-26 DIAGNOSIS — Z6827 Body mass index (BMI) 27.0-27.9, adult: Secondary | ICD-10-CM | POA: Diagnosis not present

## 2023-08-26 DIAGNOSIS — J019 Acute sinusitis, unspecified: Secondary | ICD-10-CM | POA: Diagnosis not present

## 2023-08-26 DIAGNOSIS — J101 Influenza due to other identified influenza virus with other respiratory manifestations: Secondary | ICD-10-CM | POA: Diagnosis not present

## 2023-08-26 DIAGNOSIS — R030 Elevated blood-pressure reading, without diagnosis of hypertension: Secondary | ICD-10-CM | POA: Diagnosis not present

## 2023-08-26 DIAGNOSIS — U071 COVID-19: Secondary | ICD-10-CM | POA: Diagnosis not present

## 2023-09-19 ENCOUNTER — Ambulatory Visit: Payer: Commercial Managed Care - PPO | Admitting: Family Medicine

## 2023-09-22 DIAGNOSIS — R0602 Shortness of breath: Secondary | ICD-10-CM | POA: Diagnosis not present

## 2023-09-22 DIAGNOSIS — J329 Chronic sinusitis, unspecified: Secondary | ICD-10-CM | POA: Diagnosis not present

## 2023-09-27 DIAGNOSIS — F4312 Post-traumatic stress disorder, chronic: Secondary | ICD-10-CM | POA: Diagnosis not present

## 2023-09-27 DIAGNOSIS — F332 Major depressive disorder, recurrent severe without psychotic features: Secondary | ICD-10-CM | POA: Diagnosis not present

## 2023-10-06 DIAGNOSIS — F4312 Post-traumatic stress disorder, chronic: Secondary | ICD-10-CM | POA: Diagnosis not present

## 2023-10-06 DIAGNOSIS — F332 Major depressive disorder, recurrent severe without psychotic features: Secondary | ICD-10-CM | POA: Diagnosis not present

## 2023-10-13 DIAGNOSIS — F4312 Post-traumatic stress disorder, chronic: Secondary | ICD-10-CM | POA: Diagnosis not present

## 2023-10-13 DIAGNOSIS — F332 Major depressive disorder, recurrent severe without psychotic features: Secondary | ICD-10-CM | POA: Diagnosis not present

## 2023-10-23 DIAGNOSIS — F4312 Post-traumatic stress disorder, chronic: Secondary | ICD-10-CM | POA: Diagnosis not present

## 2023-10-23 DIAGNOSIS — F332 Major depressive disorder, recurrent severe without psychotic features: Secondary | ICD-10-CM | POA: Diagnosis not present

## 2023-11-03 ENCOUNTER — Other Ambulatory Visit: Payer: Self-pay

## 2023-11-03 ENCOUNTER — Ambulatory Visit (INDEPENDENT_AMBULATORY_CARE_PROVIDER_SITE_OTHER): Payer: Commercial Managed Care - PPO | Admitting: Family Medicine

## 2023-11-03 ENCOUNTER — Encounter: Payer: Self-pay | Admitting: Family Medicine

## 2023-11-03 VITALS — BP 125/83 | HR 72 | Wt 176.7 lb

## 2023-11-03 DIAGNOSIS — Z90721 Acquired absence of ovaries, unilateral: Secondary | ICD-10-CM | POA: Diagnosis not present

## 2023-11-03 DIAGNOSIS — F4310 Post-traumatic stress disorder, unspecified: Secondary | ICD-10-CM | POA: Insufficient documentation

## 2023-11-03 DIAGNOSIS — F3341 Major depressive disorder, recurrent, in partial remission: Secondary | ICD-10-CM | POA: Diagnosis not present

## 2023-11-03 DIAGNOSIS — Z90710 Acquired absence of both cervix and uterus: Secondary | ICD-10-CM | POA: Diagnosis not present

## 2023-11-03 DIAGNOSIS — F411 Generalized anxiety disorder: Secondary | ICD-10-CM | POA: Diagnosis not present

## 2023-11-03 DIAGNOSIS — Z79899 Other long term (current) drug therapy: Secondary | ICD-10-CM | POA: Diagnosis not present

## 2023-11-03 DIAGNOSIS — E1169 Type 2 diabetes mellitus with other specified complication: Secondary | ICD-10-CM | POA: Insufficient documentation

## 2023-11-03 DIAGNOSIS — E119 Type 2 diabetes mellitus without complications: Secondary | ICD-10-CM | POA: Insufficient documentation

## 2023-11-03 DIAGNOSIS — Z7985 Long-term (current) use of injectable non-insulin antidiabetic drugs: Secondary | ICD-10-CM | POA: Diagnosis not present

## 2023-11-03 DIAGNOSIS — F329 Major depressive disorder, single episode, unspecified: Secondary | ICD-10-CM | POA: Insufficient documentation

## 2023-11-03 MED ORDER — SEMAGLUTIDE (1 MG/DOSE) 4 MG/3ML ~~LOC~~ SOPN
1.0000 mg | PEN_INJECTOR | SUBCUTANEOUS | 3 refills | Status: DC
  Filled 2023-11-03 – 2023-11-21 (×5): qty 3, 28d supply, fill #0
  Filled 2023-12-17: qty 3, 28d supply, fill #1
  Filled 2024-01-14: qty 3, 28d supply, fill #2

## 2023-11-03 MED ORDER — LAMOTRIGINE 25 MG PO TABS
25.0000 mg | ORAL_TABLET | Freq: Every day | ORAL | 1 refills | Status: DC
  Filled 2023-11-03: qty 89, 89d supply, fill #0
  Filled 2023-11-07: qty 1, 1d supply, fill #0
  Filled 2024-04-24 – 2024-04-26 (×3): qty 90, 90d supply, fill #1

## 2023-11-03 MED ORDER — ALPRAZOLAM 0.5 MG PO TABS
0.5000 mg | ORAL_TABLET | Freq: Two times a day (BID) | ORAL | 1 refills | Status: DC | PRN
  Filled 2023-11-03: qty 30, 15d supply, fill #0
  Filled 2024-02-20: qty 30, 15d supply, fill #1

## 2023-11-03 MED ORDER — FLUTICASONE PROPIONATE 50 MCG/ACT NA SUSP
2.0000 | Freq: Every day | NASAL | 6 refills | Status: AC
  Filled 2023-11-03: qty 16, 30d supply, fill #0
  Filled 2023-12-17: qty 16, 30d supply, fill #1
  Filled 2024-04-03: qty 16, 30d supply, fill #2

## 2023-11-03 NOTE — Assessment & Plan Note (Signed)
Managed with Ozempic 0.5 mg weekly. Previous medications (Metformin, Farxiga, Victoza) were ineffective or poorly tolerated. Reports good control with Ozempic and significant A1c reduction. Considering increasing dosage to 1 mg weekly for improved glycemic control and potential weight loss benefits. Discussed using two 0.5 mg doses until the new prescription is available. - Increase Ozempic to 1 mg weekly - Order A1c, cholesterol, kidney, and liver function tests - Order urine test for protein to monitor for diabetic nephropathy - ROI for last eye exam

## 2023-11-03 NOTE — Assessment & Plan Note (Signed)
Total hysterectomy due to PCOS and related complications. Not on hormone replacement therapy due to adverse effects. Discussed the importance of estrogen for bone health and the risk of osteoporosis due to early menopause. Emphasized monitoring bone health and considering hormone replacement therapy options in the future. - Monitor bone health - Consider hormone replacement therapy options in the future

## 2023-11-03 NOTE — Assessment & Plan Note (Signed)
Exacerbated by PTSD following the death of her son in 09-Nov-2018. Managed with Lamictal 25 mg daily and Xanax 0.5 mg as needed. Reports daily anxiety, occasional panic attacks, and night terrors. Therapy has been beneficial. Xanax is used appropriately, especially around significant dates related to her son's death. Emphasized the importance of therapy and appropriate Xanax use as a short-term solution. - Continue Lamictal 25 mg daily - Refill Xanax 0.5 mg as needed - Continue therapy

## 2023-11-03 NOTE — Assessment & Plan Note (Signed)
Exacerbated by PTSD following the death of her son in 2018/12/02. Managed with Lamictal 25 mg daily and Xanax 0.5 mg as needed. Reports daily anxiety, occasional panic attacks, and night terrors. Therapy has been beneficial. Xanax is used appropriately, especially around significant dates related to her son's death. Emphasized the importance of therapy and appropriate Xanax use as a short-term solution (not to be used daily). - Continue Lamictal 25 mg daily - Refill Xanax 0.5 mg as needed - Continue therapy

## 2023-11-03 NOTE — Progress Notes (Signed)
New patient visit   Patient: Crystal Mccoy   DOB: 1981/05/30   43 y.o. Female  MRN: [id number] Visit Date: 11/03/2023  Today's healthcare provider: Shirlee Latch, MD   Chief Complaint  Patient presents with   New Patient (Initial Visit)   Medication Refill    Patient is needing refills on all medications. Reports she is needing more xanax as she took her last one a few weeks ago and the other she has enough to last for this month   Care Management    Total hysterectomy in July 2020. Pap Smear no longer needed   Subjective    ARMANDINA Mccoy is a 43 y.o. female who presents today as a new patient to establish care.   Discussed the use of AI scribe software for clinical note transcription with the patient, who gave verbal consent to proceed.  History of Present Illness   The patient, with a history of anxiety, depression, heart murmur, anemia, allergies, and type 2 diabetes, presents for a routine follow-up and medication refills while establishing care. The patient's anxiety and depression are managed by a combination of Lamictal, Xanax, and therapy. The patient reports that the anxiety is more prominent than the depression and experiences occasional panic attacks and night terrors. The patient's type 2 diabetes is managed with Ozempic, which has been effective in controlling her A1c levels. The patient has tried other medications in the past, including metformin, Farxiga, and Victoza, but experienced side effects or inadequate control of her diabetes. The patient underwent a total hysterectomy due to issues related to PCOS and is not on hormone replacement therapy. The patient reports no symptoms of menopause but acknowledges the potential impact on bone health.          11/03/2023    9:43 AM  GAD 7 : Generalized Anxiety Score  Nervous, Anxious, on Edge 3  Control/stop worrying 3  Worry too much - different things 3  Trouble relaxing 3  Restless 3  Easily annoyed or  irritable 0  Afraid - awful might happen 0  Total GAD 7 Score 15  Anxiety Difficulty Somewhat difficult       11/03/2023    9:42 AM  Depression screen PHQ 2/9  Decreased Interest 1  Down, Depressed, Hopeless 1  PHQ - 2 Score 2  Altered sleeping 1  Tired, decreased energy 1  Change in appetite 1  Feeling bad or failure about yourself  0  Trouble concentrating 0  Moving slowly or fidgety/restless 0  Suicidal thoughts 0  PHQ-9 Score 5  Difficult doing work/chores Somewhat difficult     Past Medical History:  Diagnosis Date   Allergy    Anemia    Anxiety    Depression    Diabetes mellitus without complication (HCC)    Type II   Heart murmur    PCOS (polycystic ovarian syndrome)    Past Surgical History:  Procedure Laterality Date   ABDOMINAL HYSTERECTOMY     AXILLARY LYMPH NODE BIOPSY Left 10/22/2018   Procedure: AXILLARY LYMPH NODE BIOPSY;  Surgeon: Franky Macho, MD;  Location: [facility];  Service: General;  Laterality: Left;   BIOPSY  11/07/2018   Procedure: BIOPSY;  Surgeon: Lemar Lofty., MD;  Location: [facility];  Service: Endoscopy;;   CERVIX REMOVAL     CESAREAN SECTION     ESOPHAGOGASTRODUODENOSCOPY (EGD) WITH PROPOFOL N/A 11/07/2018   Procedure: ESOPHAGOGASTRODUODENOSCOPY (EGD) WITH PROPOFOL;  Surgeon: Lemar Lofty., MD;  Location: [facility]  ENDOSCOPY;  Service: Endoscopy;  Laterality: N/A;   ESOPHAGOGASTRODUODENOSCOPY (EGD) WITH PROPOFOL N/A 11/22/2018   Procedure: ESOPHAGOGASTRODUODENOSCOPY (EGD) WITH PROPOFOL;  Surgeon: Rachael Fee, MD;  Location: [facility];  Service: Endoscopy;  Laterality: N/A;   EUS N/A 11/22/2018   Procedure: UPPER ENDOSCOPIC ULTRASOUND (EUS) RADIAL;  Surgeon: Rachael Fee, MD;  Location: [facility];  Service: Endoscopy;  Laterality: N/A;   FINE NEEDLE ASPIRATION N/A 11/22/2018   Procedure: FINE NEEDLE ASPIRATION (FNA) LINEAR;  Surgeon: Rachael Fee, MD;  Location: [facility];  Service: Endoscopy;   Laterality: N/A;   FRACTURE SURGERY     ORIF FOREARM FRACTURE     TONSILLECTOMY     TOTAL ABDOMINAL HYSTERECTOMY W/ BILATERAL SALPINGOOPHORECTOMY     Family Status  Relation Name Status   Father  Deceased   Brother  (Not Specified)   PGF  (Not Specified)   Oneal Grout  (Not Specified)   Neg Hx  (Not Specified)  No partnership data on file   Family History  Problem Relation Age of Onset   Hypertension Father    Stroke Father    Cancer - Other Father        liver   Seizures Father    Diabetes Father    Heart attack Father    Coronary artery disease Father    Colon cancer Neg Hx    Breast cancer Neg Hx    Social History   Socioeconomic History   Marital status: Divorced    Spouse name: Not on file   Number of children: 2   Years of education: Not on file   Highest education level: Not on file  Occupational History   Not on file  Tobacco Use   Smoking status: Never   Smokeless tobacco: Never  Vaping Use   Vaping status: Never Used  Substance and Sexual Activity   Alcohol use: Yes    Alcohol/week: 4.0 standard drinks of alcohol    Types: 4 Glasses of wine per week   Drug use: No   Sexual activity: Not Currently    Birth control/protection: Surgical  Other Topics Concern   Not on file  Social History Narrative   Not on file   Social Drivers of Health   Financial Resource Strain: Medium Risk (09/30/2019)   Received from [facility], [facility]   Overall Financial Resource Strain (CARDIA)    Difficulty of Paying Living Expenses: Somewhat hard  Food Insecurity: Food Insecurity Present (09/30/2019)   Received from [facility], [facility]   Hunger Vital Sign    Worried About Running Out of Food in the Last Year: Sometimes true    Ran Out of Food in the Last Year: Sometimes true  Transportation Needs: No Transportation Needs (09/30/2019)   Received from [facility], [facility]   [facility] - Transportation    In the past 12 months, has lack of transportation kept you from medical appointments or from getting medications?: No    Lack of Transportation (Non-Medical): No  Physical Activity: Inactive (09/30/2019)   Received from [facility], [facility]   Exercise Vital Sign    Days of Exercise per Week: 0 days    Minutes of Exercise per Session: 0 min  Stress: Stress Concern Present (09/30/2019)   Received from [facility], [facility]   [employer] [facility] - Occupational Stress  Questionnaire    Feeling of Stress : Very much  Social Connections: Socially Isolated (09/30/2019)   Received from [facility], [facility]   Social Connection and Isolation Panel [NHANES]    Frequency of Communication with Friends and Family: More than three times a week    Frequency of Social Gatherings with Friends and Family: Never    Attends Religious Services: Never    Database administrator or Organizations: No    Attends Banker Meetings: Never    Marital Status: Separated   Outpatient Medications Prior to Visit  Medication Sig   [DISCONTINUED] ALPRAZolam (XANAX) 0.5 MG tablet Take 0.5 mg by mouth as needed for anxiety.   [DISCONTINUED] lamoTRIgine (LAMICTAL) 25 MG tablet Take 25 mg by mouth daily.   [DISCONTINUED] Semaglutide,0.25 or 0.5MG /DOS, (OZEMPIC, 0.25 OR 0.5 MG/DOSE,) 2 MG/3ML SOPN Inject 0.5 mg into the skin once a week on the same day.   [DISCONTINUED] busPIRone (BUSPAR) 5 MG tablet Take 5 mg by mouth 2 (two) times daily.    [DISCONTINUED] desvenlafaxine (PRISTIQ) 50 MG 24 hr tablet Take 100 mg by mouth daily.    [DISCONTINUED] ibuprofen (ADVIL) 800 MG tablet Take 1 tablet (800 mg total) by mouth every 8 (eight) hours as needed for moderate pain.   [DISCONTINUED] omeprazole (PRILOSEC) 40 MG capsule Take 40 mg  by mouth daily.    [DISCONTINUED] pantoprazole (PROTONIX) 20 MG tablet Take 1 tablet (20 mg total) by mouth daily.   [DISCONTINUED] Semaglutide (OZEMPIC, 0.25 OR 0.5 MG/DOSE, [place]) Inject into the skin once a week.   [DISCONTINUED] traZODone (DESYREL) 50 MG tablet Take 50 mg by mouth at bedtime.    No facility-administered medications prior to visit.   Allergies  Allergen Reactions   Oxycodone-Acetaminophen Other (See Comments)    thrush  Other Reaction(s): Thrush  Pt reports getting thrush with this medication   Cefdinir Rash, Hives and Other (See Comments)   Latex Rash and Itching    Only when on face   Omniflox [Temafloxacin] Rash   Penicillins Rash, Hives and Other (See Comments)    Has patient had a PCN reaction causing immediate rash, facial/tongue/throat swelling, SOB or lightheadedness with hypotension: Yes  Has patient had a PCN reaction causing severe rash involving mucus membranes or skin necrosis: No  Has patient had a PCN reaction that required hospitalization No  Has patient had a PCN reaction occurring within the last 10 years: No  If all of the above answers are "NO", then may proceed with Cephalosporin use.    Review of Systems      Objective    BP 125/83 (BP Location: Left Arm, Patient Position: Sitting, Cuff Size: Normal)   Pulse 72   Wt 176 lb 11.2 oz (80.2 kg)   LMP 11/21/2018   SpO2 100%   BMI 29.40 kg/m     Physical Exam Vitals reviewed.  Constitutional:      General: She is not in acute distress.    Appearance: Normal appearance. She is well-developed. She is not diaphoretic.  HENT:     Head: Normocephalic and atraumatic.  Eyes:     General: No scleral icterus.    Conjunctiva/sclera: Conjunctivae normal.  Neck:     Thyroid: No thyromegaly.  Cardiovascular:     Rate and Rhythm: Normal rate and regular rhythm.     Heart sounds: Normal heart sounds. No murmur heard. Pulmonary:     Effort: Pulmonary effort is normal. No respiratory  distress.     Breath sounds: Normal breath sounds. No wheezing, rhonchi or rales.  Musculoskeletal:     Cervical back: Neck supple.     Right lower leg: No edema.     Left lower leg: No edema.  Lymphadenopathy:     Cervical: No cervical adenopathy.  Skin:    General: Skin is warm and dry.     Findings: No rash.  Neurological:     Mental Status: She is alert and oriented to person, place, and time. Mental status is at baseline.  Psychiatric:        Mood and Affect: Mood normal.        Behavior: Behavior normal.     Depression Screen    11/03/2023    9:42 AM  PHQ 2/9 Scores  PHQ - 2 Score 2  PHQ- 9 Score 5   No results found for any visits on 11/03/23.  Assessment & Plan      Problem List Items Addressed This Visit       Endocrine   T2DM (type 2 diabetes mellitus) (HCC) - Primary   Managed with Ozempic 0.5 mg weekly. Previous medications (Metformin, Farxiga, Victoza) were ineffective or poorly tolerated. Reports good control with Ozempic and significant A1c reduction. Considering increasing dosage to 1 mg weekly for improved glycemic control and potential weight loss benefits. Discussed using two 0.5 mg doses until the new prescription is available. - Increase Ozempic to 1 mg weekly - Order A1c, cholesterol, kidney, and liver function tests - Order urine test for protein to monitor for diabetic nephropathy - ROI for last eye exam      Relevant Medications   Semaglutide, 1 MG/DOSE, 4 MG/3ML SOPN   Other Relevant Orders   Hemoglobin A1c   Comprehensive metabolic panel   Microalbumin / creatinine urine ratio   Lipid panel     Other   PTSD (post-traumatic stress disorder)   Exacerbated by PTSD following the death of her son in 11-30-2018. Managed with Lamictal 25 mg daily and Xanax 0.5 mg as needed. Reports daily anxiety, occasional panic attacks, and night terrors. Therapy has been beneficial. Xanax is used appropriately, especially around significant dates related to her  son's death. Emphasized the importance of therapy and appropriate Xanax use as a short-term solution (not to be used daily). - Continue Lamictal 25 mg daily - Refill Xanax 0.5 mg as needed - Continue therapy      Relevant Medications   ALPRAZolam (XANAX) 0.5 MG tablet   GAD (generalized anxiety disorder)   Exacerbated by PTSD following the death of her son in 11/30/2018. Managed with Lamictal 25 mg daily and Xanax 0.5 mg as needed. Reports daily anxiety, occasional panic attacks, and night terrors. Therapy has been beneficial. Xanax is used appropriately, especially around significant dates related to her son's death. Emphasized the importance of therapy and appropriate Xanax use as a short-term solution. - Continue Lamictal 25 mg daily - Refill Xanax 0.5 mg as needed - Continue therapy      Relevant Medications   ALPRAZolam (XANAX) 0.5 MG tablet   MDD (major depressive disorder)   Exacerbated by PTSD following the death of her son in 11/30/2018. Managed with Lamictal 25 mg daily and Xanax 0.5 mg as needed. Reports daily anxiety, occasional panic attacks, and night terrors. Therapy has been beneficial. Xanax is used appropriately, especially around significant dates related to her son's death. Emphasized the importance of therapy and appropriate Xanax use as a short-term solution. -  Continue Lamictal 25 mg daily - Refill Xanax 0.5 mg as needed - Continue therapy      Relevant Medications   ALPRAZolam (XANAX) 0.5 MG tablet   Status post hysterectomy with oophorectomy   Total hysterectomy due to PCOS and related complications. Not on hormone replacement therapy due to adverse effects. Discussed the importance of estrogen for bone health and the risk of osteoporosis due to early menopause. Emphasized monitoring bone health and considering hormone replacement therapy options in the future. - Monitor bone health - Consider hormone replacement therapy options in the future      Other Visit Diagnoses        Long-term use of high-risk medication       Relevant Orders   Comprehensive metabolic panel   CBC w/Diff/Platelet          General Health Maintenance Due for general health maintenance screenings. No Pap smears needed post-hysterectomy. Mammogram recommended as she is over 67 years old. History of seasonal allergies managed with Flonase. Discussed the importance of regular mammograms and maintaining allergy management. - Schedule mammogram - Continue Flonase as needed for allergies  Follow-up - Schedule follow-up in 3 months for physical and lab review - Obtain medical records from previous PCP - Send records request to Dr. Alvester Morin for eye exam results.       Return in about 3 months (around 01/31/2024) for CPE.      Shirlee Latch, MD  [facility] [phone] (phone) [phone] (fax)  [facility]

## 2023-11-05 LAB — LIPID PANEL
Chol/HDL Ratio: 3 {ratio} (ref 0.0–4.4)
Cholesterol, Total: 185 mg/dL (ref 100–199)
HDL: 62 mg/dL (ref >39)
LDL Chol Calc (NIH): 103 mg/dL — ABNORMAL HIGH (ref 0–99)
Triglycerides: 113 mg/dL (ref 0–149)
VLDL Cholesterol Cal: 20 mg/dL (ref 5–40)

## 2023-11-05 LAB — CBC WITH DIFFERENTIAL/PLATELET
Basophils Absolute: 0 10*3/uL (ref 0.0–0.2)
Basos: 1 %
EOS (ABSOLUTE): 0.2 10*3/uL (ref 0.0–0.4)
Eos: 2 %
Hematocrit: 44.6 % (ref 34.0–46.6)
Hemoglobin: 15.2 g/dL (ref 11.1–15.9)
Immature Grans (Abs): 0 10*3/uL (ref 0.0–0.1)
Immature Granulocytes: 1 %
Lymphocytes Absolute: 2.7 10*3/uL (ref 0.7–3.1)
Lymphs: 35 %
MCH: 31.6 pg (ref 26.6–33.0)
MCHC: 34.1 g/dL (ref 31.5–35.7)
MCV: 93 fL (ref 79–97)
Monocytes Absolute: 0.8 10*3/uL (ref 0.1–0.9)
Monocytes: 11 %
Neutrophils Absolute: 3.8 10*3/uL (ref 1.4–7.0)
Neutrophils: 50 %
Platelets: 370 10*3/uL (ref 150–450)
RBC: 4.81 x10E6/uL (ref 3.77–5.28)
RDW: 11.8 % (ref 11.7–15.4)
WBC: 7.6 10*3/uL (ref 3.4–10.8)

## 2023-11-05 LAB — COMPREHENSIVE METABOLIC PANEL
ALT: 21 [IU]/L (ref 0–32)
AST: 21 [IU]/L (ref 0–40)
Albumin: 5 g/dL — ABNORMAL HIGH (ref 3.9–4.9)
Alkaline Phosphatase: 76 [IU]/L (ref 44–121)
BUN/Creatinine Ratio: 17 (ref 9–23)
BUN: 13 mg/dL (ref 6–24)
Bilirubin Total: 0.8 mg/dL (ref 0.0–1.2)
CO2: 22 mmol/L (ref 20–29)
Calcium: 9.9 mg/dL (ref 8.7–10.2)
Chloride: 99 mmol/L (ref 96–106)
Creatinine, Ser: 0.75 mg/dL (ref 0.57–1.00)
Globulin, Total: 3 g/dL (ref 1.5–4.5)
Glucose: 99 mg/dL (ref 70–99)
Potassium: 4.3 mmol/L (ref 3.5–5.2)
Sodium: 138 mmol/L (ref 134–144)
Total Protein: 8 g/dL (ref 6.0–8.5)
eGFR: 102 mL/min/{1.73_m2} (ref >59)

## 2023-11-05 LAB — MICROALBUMIN / CREATININE URINE RATIO
Creatinine, Urine: 257.1 mg/dL
Microalb/Creat Ratio: 4 mg/g{creat} (ref 0–29)
Microalbumin, Urine: 10.4 ug/mL

## 2023-11-05 LAB — HEMOGLOBIN A1C
Est. average glucose Bld gHb Est-mCnc: 114 mg/dL
Hgb A1c MFr Bld: 5.6 % (ref 4.8–5.6)

## 2023-11-06 ENCOUNTER — Encounter: Payer: Self-pay | Admitting: Family Medicine

## 2023-11-06 DIAGNOSIS — F4312 Post-traumatic stress disorder, chronic: Secondary | ICD-10-CM | POA: Diagnosis not present

## 2023-11-06 DIAGNOSIS — F332 Major depressive disorder, recurrent severe without psychotic features: Secondary | ICD-10-CM | POA: Diagnosis not present

## 2023-11-07 ENCOUNTER — Other Ambulatory Visit: Payer: Self-pay

## 2023-11-08 ENCOUNTER — Other Ambulatory Visit: Payer: Self-pay

## 2023-11-08 DIAGNOSIS — E7800 Pure hypercholesterolemia, unspecified: Secondary | ICD-10-CM

## 2023-11-08 DIAGNOSIS — E119 Type 2 diabetes mellitus without complications: Secondary | ICD-10-CM

## 2023-11-08 MED ORDER — ROSUVASTATIN CALCIUM 5 MG PO TABS
5.0000 mg | ORAL_TABLET | Freq: Every day | ORAL | 1 refills | Status: DC
  Filled 2023-11-08: qty 90, 90d supply, fill #0
  Filled 2024-02-20: qty 90, 90d supply, fill #1

## 2023-11-13 ENCOUNTER — Other Ambulatory Visit: Payer: Self-pay

## 2023-11-13 DIAGNOSIS — F332 Major depressive disorder, recurrent severe without psychotic features: Secondary | ICD-10-CM | POA: Diagnosis not present

## 2023-11-13 DIAGNOSIS — F4312 Post-traumatic stress disorder, chronic: Secondary | ICD-10-CM | POA: Diagnosis not present

## 2023-11-17 ENCOUNTER — Other Ambulatory Visit: Payer: Self-pay

## 2023-11-20 DIAGNOSIS — F332 Major depressive disorder, recurrent severe without psychotic features: Secondary | ICD-10-CM | POA: Diagnosis not present

## 2023-11-20 DIAGNOSIS — F4312 Post-traumatic stress disorder, chronic: Secondary | ICD-10-CM | POA: Diagnosis not present

## 2023-11-21 ENCOUNTER — Other Ambulatory Visit: Payer: Self-pay

## 2023-11-22 ENCOUNTER — Other Ambulatory Visit: Payer: Self-pay

## 2023-11-22 ENCOUNTER — Encounter: Payer: Self-pay | Admitting: Family Medicine

## 2023-11-23 ENCOUNTER — Other Ambulatory Visit: Payer: Self-pay

## 2023-11-30 ENCOUNTER — Telehealth: Payer: Self-pay

## 2023-11-30 NOTE — Telephone Encounter (Signed)
 Copied from CRM [id number]. Topic: Clinical - Medication Question >> Nov 23, 2023  8:40 AM Crystal Mccoy J wrote: Reason for CRM: Pt stated the medication Ozempic need prior auth, she was wondering was that already put through to her insurance cause she cannot afford the monthly payments.  Best call back [phone]

## 2023-12-04 ENCOUNTER — Telehealth: Payer: Self-pay | Admitting: Pharmacy Technician

## 2023-12-04 ENCOUNTER — Other Ambulatory Visit (HOSPITAL_COMMUNITY): Payer: Self-pay

## 2023-12-04 DIAGNOSIS — F332 Major depressive disorder, recurrent severe without psychotic features: Secondary | ICD-10-CM | POA: Diagnosis not present

## 2023-12-04 DIAGNOSIS — F4312 Post-traumatic stress disorder, chronic: Secondary | ICD-10-CM | POA: Diagnosis not present

## 2023-12-04 NOTE — Telephone Encounter (Signed)
 Pharmacy Patient Advocate Encounter   Received notification from Pt Calls Messages that prior authorization for OZEMPIC 1MG /DOSE is required/requested.   Insurance verification completed.   The patient is insured through  [employer] [place] MEDICAID   Per test claim: PA required; PA submitted to above mentioned insurance via CoverMyMeds Key/confirmation #/EOC [id number] Status is pending

## 2023-12-04 NOTE — Telephone Encounter (Signed)
 PA request has been Started. New Encounter has been or will be created for follow up. For additional info see Pharmacy Prior Auth telephone encounter from 12/04/2023.

## 2023-12-05 ENCOUNTER — Other Ambulatory Visit (HOSPITAL_COMMUNITY): Payer: Self-pay

## 2023-12-05 NOTE — Telephone Encounter (Signed)
 Pharmacy Patient Advocate Encounter  Received notification from  [facility] [employer] [place] MEDICAID  that Prior Authorization for [facility] 1MG /DOSE has been APPROVED from 12/04/2023 to 12/03/2024. Ran test claim, Copay is $4300. This test claim was processed through [facility]- copay amounts may vary at other pharmacies due to pharmacy/plan contracts, or as the patient moves through the different stages of their insurance plan.   PA #/Case ID/Reference #: [id number]

## 2023-12-05 NOTE — Telephone Encounter (Signed)
 Called but was only able to leave a voice message informing the pt, per her insurance she has been approved for ozempic.

## 2023-12-20 DIAGNOSIS — F332 Major depressive disorder, recurrent severe without psychotic features: Secondary | ICD-10-CM | POA: Diagnosis not present

## 2023-12-20 DIAGNOSIS — F4312 Post-traumatic stress disorder, chronic: Secondary | ICD-10-CM | POA: Diagnosis not present

## 2023-12-25 DIAGNOSIS — F4312 Post-traumatic stress disorder, chronic: Secondary | ICD-10-CM | POA: Diagnosis not present

## 2023-12-25 DIAGNOSIS — F332 Major depressive disorder, recurrent severe without psychotic features: Secondary | ICD-10-CM | POA: Diagnosis not present

## 2024-01-01 DIAGNOSIS — F332 Major depressive disorder, recurrent severe without psychotic features: Secondary | ICD-10-CM | POA: Diagnosis not present

## 2024-01-01 DIAGNOSIS — F4312 Post-traumatic stress disorder, chronic: Secondary | ICD-10-CM | POA: Diagnosis not present

## 2024-01-01 DIAGNOSIS — E113393 Type 2 diabetes mellitus with moderate nonproliferative diabetic retinopathy without macular edema, bilateral: Secondary | ICD-10-CM | POA: Diagnosis not present

## 2024-01-08 DIAGNOSIS — F332 Major depressive disorder, recurrent severe without psychotic features: Secondary | ICD-10-CM | POA: Diagnosis not present

## 2024-01-08 DIAGNOSIS — F4312 Post-traumatic stress disorder, chronic: Secondary | ICD-10-CM | POA: Diagnosis not present

## 2024-01-15 DIAGNOSIS — F332 Major depressive disorder, recurrent severe without psychotic features: Secondary | ICD-10-CM | POA: Diagnosis not present

## 2024-01-15 DIAGNOSIS — F4312 Post-traumatic stress disorder, chronic: Secondary | ICD-10-CM | POA: Diagnosis not present

## 2024-01-22 DIAGNOSIS — F332 Major depressive disorder, recurrent severe without psychotic features: Secondary | ICD-10-CM | POA: Diagnosis not present

## 2024-01-22 DIAGNOSIS — F4312 Post-traumatic stress disorder, chronic: Secondary | ICD-10-CM | POA: Diagnosis not present

## 2024-02-05 ENCOUNTER — Encounter: Payer: Self-pay | Admitting: Family Medicine

## 2024-02-05 ENCOUNTER — Other Ambulatory Visit: Payer: Self-pay | Admitting: Family Medicine

## 2024-02-05 ENCOUNTER — Other Ambulatory Visit: Payer: Self-pay

## 2024-02-05 ENCOUNTER — Ambulatory Visit: Payer: Commercial Managed Care - PPO | Admitting: Family Medicine

## 2024-02-05 VITALS — BP 118/68 | HR 74 | Ht 65.0 in | Wt 182.0 lb

## 2024-02-05 DIAGNOSIS — F4310 Post-traumatic stress disorder, unspecified: Secondary | ICD-10-CM | POA: Diagnosis not present

## 2024-02-05 DIAGNOSIS — L409 Psoriasis, unspecified: Secondary | ICD-10-CM | POA: Diagnosis not present

## 2024-02-05 DIAGNOSIS — E785 Hyperlipidemia, unspecified: Secondary | ICD-10-CM | POA: Diagnosis not present

## 2024-02-05 DIAGNOSIS — F3341 Major depressive disorder, recurrent, in partial remission: Secondary | ICD-10-CM

## 2024-02-05 DIAGNOSIS — Z0001 Encounter for general adult medical examination with abnormal findings: Secondary | ICD-10-CM | POA: Diagnosis not present

## 2024-02-05 DIAGNOSIS — F4312 Post-traumatic stress disorder, chronic: Secondary | ICD-10-CM | POA: Diagnosis not present

## 2024-02-05 DIAGNOSIS — E1169 Type 2 diabetes mellitus with other specified complication: Secondary | ICD-10-CM | POA: Insufficient documentation

## 2024-02-05 DIAGNOSIS — F332 Major depressive disorder, recurrent severe without psychotic features: Secondary | ICD-10-CM | POA: Diagnosis not present

## 2024-02-05 DIAGNOSIS — F411 Generalized anxiety disorder: Secondary | ICD-10-CM | POA: Diagnosis not present

## 2024-02-05 DIAGNOSIS — Z0000 Encounter for general adult medical examination without abnormal findings: Secondary | ICD-10-CM

## 2024-02-05 DIAGNOSIS — Z7985 Long-term (current) use of injectable non-insulin antidiabetic drugs: Secondary | ICD-10-CM

## 2024-02-05 MED ORDER — DEXCOM G7 SENSOR MISC
11 refills | Status: AC
  Filled 2024-02-05: qty 3, 28d supply, fill #0
  Filled 2024-02-05 – 2024-02-06 (×2): qty 3, 30d supply, fill #0
  Filled 2024-03-02: qty 3, 30d supply, fill #1
  Filled 2024-04-03: qty 3, 30d supply, fill #2
  Filled 2024-05-27: qty 3, 30d supply, fill #3
  Filled 2024-06-28: qty 3, 30d supply, fill #4
  Filled 2024-07-29: qty 3, 30d supply, fill #5

## 2024-02-05 MED ORDER — SEMAGLUTIDE (1 MG/DOSE) 4 MG/3ML ~~LOC~~ SOPN
1.0000 mg | PEN_INJECTOR | SUBCUTANEOUS | 1 refills | Status: DC
  Filled 2024-02-05: qty 9, 84d supply, fill #0
  Filled 2024-02-20: qty 3, 28d supply, fill #0
  Filled 2024-03-21 (×2): qty 3, 28d supply, fill #1
  Filled 2024-04-24 – 2024-04-26 (×3): qty 3, 28d supply, fill #2
  Filled 2024-05-27: qty 3, 28d supply, fill #3
  Filled 2024-06-28: qty 3, 28d supply, fill #4
  Filled 2024-07-29: qty 3, 28d supply, fill #5

## 2024-02-05 MED ORDER — CLOBETASOL PROPIONATE EMULSION 0.05 % EX FOAM
1.0000 | Freq: Two times a day (BID) | CUTANEOUS | 5 refills | Status: DC
  Filled 2024-02-05 (×2): qty 100, 50d supply, fill #0
  Filled 2024-02-20: qty 50, 25d supply, fill #0

## 2024-02-05 NOTE — Assessment & Plan Note (Signed)
 Exacerbated by PTSD following the death of her son in 2018/12/02. Managed with Lamictal 25 mg daily and Xanax 0.5 mg as needed. Reports daily anxiety, occasional panic attacks, and night terrors. Therapy has been beneficial. Xanax is used appropriately, especially around significant dates related to her son's death. Emphasized the importance of therapy and appropriate Xanax use as a short-term solution (not to be used daily). - Continue Lamictal 25 mg daily - Refill Xanax 0.5 mg as needed - Continue therapy

## 2024-02-05 NOTE — Assessment & Plan Note (Signed)
 Well controlled

## 2024-02-05 NOTE — Progress Notes (Signed)
 Complete physical exam   Patient: Crystal Mccoy   DOB: 1981-01-20   42 y.o. 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 female who presents today for a complete physical exam.    Discussed the use of AI scribe software for clinical note transcription with the patient, who gave verbal consent to proceed.  History of Present Illness   Crystal Mccoy "Crystal Mccoy" is a 43 year old female with diabetes, anxiety, and psoriasis who presents for an annual physical exam.  Anxiety is stable with lamotrigine  and Xanax  as needed, though Xanax  is not used daily. Increased difficulty occurs around her son's birthday.  Diabetes management includes Crestor  and Ozempic , with significant blood sugar fluctuations. She dislikes finger sticks and is adjusting her diet. Ozempic  requires travel to a specialty pharmacy due to local supply issues.  Psoriasis is severe, primarily affecting the scalp. Topical treatments, including fluocinonide, are ineffective and cause greasy hair and hair loss. 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 No abdominal pain. She maintains foot and toenail health to preserve mobility.         02/05/2024    2:26 PM 11/03/2023    9:43 AM  GAD 7 : Generalized Anxiety Score  Nervous, Anxious, on Edge 3 3  Control/stop worrying 3 3  Worry too much - different things 3 3  Trouble relaxing 2 3  Restless 2 3  Easily annoyed or irritable 0 0  Afraid - awful might happen 1 0  Total GAD 7 Score 14 15  Anxiety Difficulty  Not difficult at all Somewhat difficult    Last depression screening scores    02/05/2024    2:25 PM 11/03/2023    9:42 AM  PHQ 2/9 Scores  PHQ - 2 Score 2 2  PHQ- 9 Score 10 5   Last fall risk screening    11/03/2023    9:43 AM  Fall Risk   Falls in the past year? 0  Number falls in past yr: 0  Injury with Fall? 0  Risk for fall due to : No Fall Risks  Follow up Falls evaluation completed        Medications: Outpatient Medications Prior to Visit  Medication Sig   ALPRAZolam  (XANAX ) 0.5 MG tablet Take 1 tablet (0.5 mg total) by mouth 2 (two) times daily as needed for anxiety.   fluticasone  (FLONASE ) 50 MCG/ACT nasal spray Place 2 sprays into both nostrils daily.   lamoTRIgine  (LAMICTAL ) 25 MG tablet Take 1 tablet (25 mg total) by mouth daily.   rosuvastatin  (CRESTOR ) 5 MG tablet Take 1 tablet (5 mg total) by mouth daily.   [DISCONTINUED] Semaglutide , 1 MG/DOSE, 4 MG/3ML SOPN Inject 1 mg into the skin once a week.   No facility-administered medications prior to visit.    Review of Systems    Objective    BP 118/68   Pulse 74   Ht 5'  5" (1.651 m)   Wt 182 lb (82.6 kg)   LMP 11/21/2018   SpO2 100%   BMI 30.29 kg/m    Physical Exam Vitals reviewed.  Constitutional:      General: She is not in acute distress.    Appearance: Normal appearance. She is well-developed. She is not diaphoretic.  HENT:     Head: Normocephalic and atraumatic.     Right Ear: External ear normal.     Left Ear: External ear normal.     Nose: Nose normal.     Mouth/Throat:     Mouth: Mucous membranes are moist.     Pharynx: Oropharynx is clear. No oropharyngeal exudate.  Eyes:     General: No scleral icterus.    Conjunctiva/sclera: Conjunctivae normal.     Pupils: Pupils are equal, round, and reactive to light.  Neck:     Thyroid: No thyromegaly.  Cardiovascular:     Rate and Rhythm: Normal rate and regular rhythm.     Heart sounds: Normal heart sounds. No murmur  heard. Pulmonary:     Effort: Pulmonary effort is normal. No respiratory distress.     Breath sounds: Normal breath sounds. No wheezing or rales.  Abdominal:     General: There is no distension.     Palpations: Abdomen is soft.     Tenderness: There is no abdominal tenderness.  Musculoskeletal:        General: No deformity.     Cervical back: Neck supple.     Right lower leg: No edema.     Left lower leg: No edema.  Lymphadenopathy:     Cervical: No cervical adenopathy.  Skin:    General: Skin is warm and dry.     Findings: Rash (psoriasis of scalp) present.  Neurological:     Mental Status: She is alert and oriented to person, place, and time. Mental status is at baseline.     Gait: Gait normal.  Psychiatric:        Mood and Affect: Mood normal.        Behavior: Behavior normal.        Thought Content: Thought content normal.      No results found for any visits on 02/05/24.  Assessment & Plan    Routine Health Maintenance and Physical Exam  Exercise Activities and Dietary recommendations  Goals   None     Immunization History  Administered Date(s) Administered   Hepatitis B 02/05/2018   Hepatitis B, ADULT 03/08/2018, 08/21/2018   Influenza-Unspecified 06/26/2023   PPD Test 02/05/2018   Tdap 02/05/2018    Health Maintenance  Topic Date Due   COVID-19 Vaccine (1) Never done   OPHTHALMOLOGY EXAM  Never done   Pneumococcal Vaccine 66-27 Years old (1 of 2 - PCV) Never done   INFLUENZA VACCINE  04/19/2024   HEMOGLOBIN A1C  05/02/2024   Diabetic kidney evaluation - eGFR measurement  11/02/2024   Diabetic kidney evaluation - Urine ACR  11/02/2024   FOOT EXAM  02/04/2025   DTaP/Tdap/Td (2 - Td or Tdap) 02/06/2028   Hepatitis C Screening  Completed   HIV Screening  Completed   HPV VACCINES  Aged Out   Meningococcal B Vaccine  Aged Out    Discussed health benefits of physical activity, and encouraged her to engage in regular exercise appropriate for her age and  condition.  Problem List Items Addressed This Visit       Endocrine   T2DM (type 2 diabetes  mellitus) (HCC)   Blood glucose levels are fluctuating with episodes of hyperglycemia and hypoglycemia. Currently on Ozempic  1 mg weekly. Interested in using a continuous glucose monitor (CGM) to better manage glucose levels and reduce finger sticks. Prefers Dexcom CGM, pending insurance coverage. - Prescribe Dexcom G7 CGM, contingent on insurance approval. If denied, prescribe Freestyle CGM. - Perform foot exam to assess pulses, sores, and sensation. - Order A1c and kidney function tests to monitor diabetes management. - Ensure refills for Ozempic  and attempt to obtain a 7-month supply if possible.      Relevant Medications   Semaglutide , 1 MG/DOSE, 4 MG/3ML SOPN   Other Relevant Orders   Hemoglobin A1c   Comprehensive metabolic panel with GFR   Lipid panel   Hyperlipidemia associated with type 2 diabetes mellitus (HCC)   Managed with Crestor  5 mg daily. Inconsistent medication adherence may affect cholesterol levels. - Order cholesterol test to assess current levels. - Encourage consistent use of Crestor .      Relevant Medications   Semaglutide , 1 MG/DOSE, 4 MG/3ML SOPN   Other Relevant Orders   Comprehensive metabolic panel with GFR   Lipid panel     Musculoskeletal and Integument   Psoriasis   Psoriasis localized to the scalp is severe with significant flaking and irritation. Current treatment with fluocinonide is ineffective. Hesitant to use systemic immunosuppressants due to occupational exposure to infectious diseases. Clobetasol  foam recommended as a stronger topical treatment. - Prescribe clobetasol  foam for scalp application. - Refer to dermatology for further management and consideration of systemic treatments.      Relevant Medications   Clobetasol  Propionate Emulsion 0.05 % topical foam   Other Relevant Orders   Ambulatory referral to Dermatology     Other   PTSD  (post-traumatic stress disorder)   Exacerbated by PTSD following the death of her son in Mar 01, 2019. Managed with Lamictal  25 mg daily and Xanax  0.5 mg as needed. Reports daily anxiety, occasional panic attacks, and night terrors. Therapy has been beneficial. Xanax  is used appropriately, especially around significant dates related to her son's death. Emphasized the importance of therapy and appropriate Xanax  use as a short-term solution (not to be used daily). - Continue Lamictal  25 mg daily - Refill Xanax  0.5 mg as needed - Continue therapy      GAD (generalized anxiety disorder)   Anxiety is well-managed with current medication regimen. On lamotrigine  daily and Xanax  0.5 mg as needed, not used daily. - Continue current medication regimen.       MDD (major depressive disorder)   Well controlled      Other Visit Diagnoses       Encounter for annual physical exam    -  Primary        Return in about 6 months (around 08/07/2024) for chronic disease f/u.     Aden Agreste, MD  [facility] [phone] (phone) [phone] (fax)  [facility]

## 2024-02-05 NOTE — Assessment & Plan Note (Signed)
 Psoriasis localized to the scalp is severe with significant flaking and irritation. Current treatment with fluocinonide is ineffective. Hesitant to use systemic immunosuppressants due to occupational exposure to infectious diseases. Clobetasol  foam recommended as a stronger topical treatment. - Prescribe clobetasol  foam for scalp application. - Refer to dermatology for further management and consideration of systemic treatments.

## 2024-02-05 NOTE — Assessment & Plan Note (Signed)
 Managed with Crestor  5 mg daily. Inconsistent medication adherence may affect cholesterol levels. - Order cholesterol test to assess current levels. - Encourage consistent use of Crestor .

## 2024-02-05 NOTE — Assessment & Plan Note (Signed)
 Anxiety is well-managed with current medication regimen. On lamotrigine  daily and Xanax  0.5 mg as needed, not used daily. - Continue current medication regimen.

## 2024-02-05 NOTE — Assessment & Plan Note (Signed)
 Blood glucose levels are fluctuating with episodes of hyperglycemia and hypoglycemia. Currently on Ozempic  1 mg weekly. Interested in using a continuous glucose monitor (CGM) to better manage glucose levels and reduce finger sticks. Prefers Dexcom CGM, pending insurance coverage. - Prescribe Dexcom G7 CGM, contingent on insurance approval. If denied, prescribe Freestyle CGM. - Perform foot exam to assess pulses, sores, and sensation. - Order A1c and kidney function tests to monitor diabetes management. - Ensure refills for Ozempic  and attempt to obtain a 60-month supply if possible.

## 2024-02-06 ENCOUNTER — Other Ambulatory Visit: Payer: Self-pay

## 2024-02-06 ENCOUNTER — Ambulatory Visit: Payer: Self-pay | Admitting: Family Medicine

## 2024-02-06 ENCOUNTER — Telehealth: Payer: Self-pay

## 2024-02-06 ENCOUNTER — Other Ambulatory Visit (HOSPITAL_COMMUNITY): Payer: Self-pay

## 2024-02-06 LAB — COMPREHENSIVE METABOLIC PANEL WITH GFR
ALT: 21 IU/L (ref 0–32)
AST: 19 IU/L (ref 0–40)
Albumin: 4.8 g/dL (ref 3.9–4.9)
Alkaline Phosphatase: 81 IU/L (ref 44–121)
BUN/Creatinine Ratio: 16 (ref 9–23)
BUN: 11 mg/dL (ref 6–24)
Bilirubin Total: 0.4 mg/dL (ref 0.0–1.2)
CO2: 21 mmol/L (ref 20–29)
Calcium: 9.6 mg/dL (ref 8.7–10.2)
Chloride: 106 mmol/L (ref 96–106)
Creatinine, Ser: 0.69 mg/dL (ref 0.57–1.00)
Globulin, Total: 2.4 g/dL (ref 1.5–4.5)
Glucose: 86 mg/dL (ref 70–99)
Potassium: 4.8 mmol/L (ref 3.5–5.2)
Sodium: 146 mmol/L — ABNORMAL HIGH (ref 134–144)
Total Protein: 7.2 g/dL (ref 6.0–8.5)
eGFR: 111 mL/min/{1.73_m2} (ref >59)

## 2024-02-06 LAB — LIPID PANEL
Chol/HDL Ratio: 2.7 ratio (ref 0.0–4.4)
Cholesterol, Total: 167 mg/dL (ref 100–199)
HDL: 61 mg/dL (ref >39)
LDL Chol Calc (NIH): 90 mg/dL (ref 0–99)
Triglycerides: 84 mg/dL (ref 0–149)
VLDL Cholesterol Cal: 16 mg/dL (ref 5–40)

## 2024-02-06 LAB — HEMOGLOBIN A1C
Est. average glucose Bld gHb Est-mCnc: 108 mg/dL
Hgb A1c MFr Bld: 5.4 % (ref 4.8–5.6)

## 2024-02-06 NOTE — Telephone Encounter (Signed)
 Pharmacy Patient Advocate Encounter   Received notification from Onbase that prior authorization for Dexcom G7 Sensor is required/requested.   Insurance verification completed.   The patient is insured through [facility] .   Per test claim: PA required; PA submitted to above mentioned insurance via CoverMyMeds Key/confirmation #/EOC [facility] Status is pending

## 2024-02-07 ENCOUNTER — Other Ambulatory Visit (HOSPITAL_COMMUNITY): Payer: Self-pay

## 2024-02-07 NOTE — Telephone Encounter (Signed)
 Pharmacy Patient Advocate Encounter  Received notification from [facility] that Prior Authorization for Dexcom G7 Sensor has been APPROVED from 02/06/24 to 02/05/25. Unable to obtain price due to refill too soon rejection, last fill date 02/06/24 next available fill date06/13/25   PA #/Case ID/Reference #: [id number]

## 2024-02-20 ENCOUNTER — Other Ambulatory Visit: Payer: Self-pay

## 2024-02-20 DIAGNOSIS — F4312 Post-traumatic stress disorder, chronic: Secondary | ICD-10-CM | POA: Diagnosis not present

## 2024-02-20 DIAGNOSIS — F332 Major depressive disorder, recurrent severe without psychotic features: Secondary | ICD-10-CM | POA: Diagnosis not present

## 2024-02-21 ENCOUNTER — Other Ambulatory Visit: Payer: Self-pay

## 2024-02-21 DIAGNOSIS — L400 Psoriasis vulgaris: Secondary | ICD-10-CM | POA: Diagnosis not present

## 2024-02-21 DIAGNOSIS — Z111 Encounter for screening for respiratory tuberculosis: Secondary | ICD-10-CM | POA: Diagnosis not present

## 2024-02-21 DIAGNOSIS — Z79899 Other long term (current) drug therapy: Secondary | ICD-10-CM | POA: Diagnosis not present

## 2024-02-21 MED ORDER — FLUCONAZOLE 200 MG PO TABS
200.0000 mg | ORAL_TABLET | ORAL | 0 refills | Status: DC
  Filled 2024-02-21: qty 12, 84d supply, fill #0

## 2024-02-27 DIAGNOSIS — F4312 Post-traumatic stress disorder, chronic: Secondary | ICD-10-CM | POA: Diagnosis not present

## 2024-02-27 DIAGNOSIS — F332 Major depressive disorder, recurrent severe without psychotic features: Secondary | ICD-10-CM | POA: Diagnosis not present

## 2024-02-28 ENCOUNTER — Other Ambulatory Visit: Payer: Self-pay

## 2024-02-28 MED ORDER — VITAMIN D (ERGOCALCIFEROL) 50000 UNITS PO CAPS
1.0000 | ORAL_CAPSULE | ORAL | 1 refills | Status: AC
  Filled 2024-02-28: qty 12, 84d supply, fill #0
  Filled 2024-04-24 – 2024-05-27 (×3): qty 12, 84d supply, fill #1

## 2024-03-02 ENCOUNTER — Other Ambulatory Visit: Payer: Self-pay

## 2024-03-04 DIAGNOSIS — F4312 Post-traumatic stress disorder, chronic: Secondary | ICD-10-CM | POA: Diagnosis not present

## 2024-03-04 DIAGNOSIS — F332 Major depressive disorder, recurrent severe without psychotic features: Secondary | ICD-10-CM | POA: Diagnosis not present

## 2024-03-11 DIAGNOSIS — F4312 Post-traumatic stress disorder, chronic: Secondary | ICD-10-CM | POA: Diagnosis not present

## 2024-03-11 DIAGNOSIS — F332 Major depressive disorder, recurrent severe without psychotic features: Secondary | ICD-10-CM | POA: Diagnosis not present

## 2024-03-13 ENCOUNTER — Other Ambulatory Visit: Payer: Self-pay

## 2024-03-13 MED ORDER — TREMFYA ONE-PRESS 100 MG/ML ~~LOC~~ SOAJ: SUBCUTANEOUS | 2 refills | Status: DC

## 2024-03-13 MED ORDER — TREMFYA PEN 100 MG/ML ~~LOC~~ SOAJ
SUBCUTANEOUS | 1 refills | Status: DC
  Filled 2024-03-26: qty 1, 28d supply, fill #0

## 2024-03-19 DIAGNOSIS — F4312 Post-traumatic stress disorder, chronic: Secondary | ICD-10-CM | POA: Diagnosis not present

## 2024-03-19 DIAGNOSIS — F332 Major depressive disorder, recurrent severe without psychotic features: Secondary | ICD-10-CM | POA: Diagnosis not present

## 2024-03-21 ENCOUNTER — Other Ambulatory Visit: Payer: Self-pay

## 2024-03-21 ENCOUNTER — Ambulatory Visit: Admitting: Family Medicine

## 2024-03-21 ENCOUNTER — Other Ambulatory Visit: Payer: Self-pay | Admitting: Pharmacy Technician

## 2024-03-21 VITALS — BP 118/66 | Ht 65.0 in | Wt 185.6 lb

## 2024-03-21 DIAGNOSIS — Z90710 Acquired absence of both cervix and uterus: Secondary | ICD-10-CM | POA: Diagnosis not present

## 2024-03-21 DIAGNOSIS — Z90721 Acquired absence of ovaries, unilateral: Secondary | ICD-10-CM | POA: Diagnosis not present

## 2024-03-21 DIAGNOSIS — E2839 Other primary ovarian failure: Secondary | ICD-10-CM | POA: Diagnosis not present

## 2024-03-21 DIAGNOSIS — E1169 Type 2 diabetes mellitus with other specified complication: Secondary | ICD-10-CM | POA: Diagnosis not present

## 2024-03-21 DIAGNOSIS — L231 Allergic contact dermatitis due to adhesives: Secondary | ICD-10-CM | POA: Diagnosis not present

## 2024-03-21 DIAGNOSIS — K5903 Drug induced constipation: Secondary | ICD-10-CM | POA: Diagnosis not present

## 2024-03-21 MED ORDER — ESTRADIOL 1 MG PO TABS
1.0000 mg | ORAL_TABLET | Freq: Every day | ORAL | 3 refills | Status: DC
  Filled 2024-03-21 (×2): qty 30, 30d supply, fill #0
  Filled 2024-04-24 – 2024-04-26 (×3): qty 30, 30d supply, fill #1
  Filled 2024-05-27: qty 30, 30d supply, fill #2
  Filled 2024-06-28: qty 30, 30d supply, fill #3

## 2024-03-21 NOTE — Assessment & Plan Note (Signed)
 Blood sugars fluctuate significantly, ranging from the 40s to the 400s. Dexcom app shows 84% time in range, with less than 1% very high and 2% low or very low over the last 90 days. Variability likely due to insulin  resistance and dietary factors. - Continue Ozempic  1 mg weekly - Monitor blood sugar trends using Dexcom app - Evaluate dietary intake for high glycemic index foods - Consider switching to [employer] if cost-effective; check pharmacy pricing

## 2024-03-21 NOTE — Progress Notes (Signed)
 Established patient visit   Patient: Crystal Mccoy   DOB: 06/21/1981   43 y.o. Female  MRN: [id number] Visit Date: 03/21/2024  Today's healthcare provider: Jon Eva, MD   Chief Complaint  Patient presents with   Blood Sugar Problem    Patient is experiencing glucose issues, has seen some readings as high as 400s and low as 40s. Low readings will wake her up at night and feels shaky. Feels fatigued when it is reading high. Wants to know if there is another recommendation for ozempic , states she has been on it for a long time. Is having a reaction with the dexcom, gets red, itchy near adhesive and tends to bruise near the needle site.    Subjective    HPI HPI     Blood Sugar Problem    Additional comments: Patient is experiencing glucose issues, has seen some readings as high as 400s and low as 40s. Low readings will wake her up at night and feels shaky. Feels fatigued when it is reading high. Wants to know if there is another recommendation for ozempic , states she has been on it for a long time. Is having a reaction with the dexcom, gets red, itchy near adhesive and tends to bruise near the needle site.       Last edited by Cherry Chiquita HERO on 03/21/2024  2:36 PM.       Discussed the use of AI scribe software for clinical note transcription with the patient, who gave verbal consent to proceed.  History of Present Illness   Crystal Mccoy Crystal Mccoy is a 44 year old female with type 2 diabetes who presents with erratic blood sugar levels and skin reactions to her Dexcom device.  She experiences significant fluctuations in blood glucose levels, ranging from the 40s to the 400s, not consistently related to dietary intake. Her Dexcom device shows erratic patterns, with a time in range of 84% over the last 90 days, less than 1% very high, and 2% low or very low.  She has a red, itchy rash and bruising under the Dexcom device, attributed to an allergic reaction to the adhesive.  Different over patches, including Tegaderm, also cause skin irritation. She changes the over patch daily and has contacted Dexcom for additional supplies, but the skin reaction persists.  She underwent a hysterectomy four years ago, including the removal of her ovaries, leading to surgical menopause. She experiences night sweats but no consistent hot flashes. She is not on hormone replacement therapy and has decreased energy levels.  She is on Ozempic  for diabetes management, which is effective for blood sugar control but causes significant constipation. She takes a stool softener daily and uses magnesium citrate weekly to manage bowel movements.  She works night shifts, contributing to her fatigue, and consumes energy drinks to manage energy levels during work hours.       Medications: Outpatient Medications Prior to Visit  Medication Sig   ALPRAZolam  (XANAX ) 0.5 MG tablet Take 1 tablet (0.5 mg total) by mouth 2 (two) times daily as needed for anxiety.   Clobetasol  Propionate Emulsion 0.05 % topical foam Apply 1 Application topically 2 (two) times daily.   Continuous Glucose Sensor (DEXCOM G7 SENSOR) MISC Use to monitor glucose continuously. Change every 10 days.   fluconazole  (DIFLUCAN ) 200 MG tablet Take 200 mg by mouth once a week.   fluticasone  (FLONASE ) 50 MCG/ACT nasal spray Place 2 sprays into both nostrils daily.   guselkumab  (TREMFYA   ONE-PRESS) 100 MG/ML pen INJECT 1 PEN UNDER THE SKIN EVERY 8 WEEKS.   guselkumab  (TREMFYA  PEN) 100 MG/ML pen 1(one) injection subcutaneous at week 0 and 4 for loading dose   lamoTRIgine  (LAMICTAL ) 25 MG tablet Take 1 tablet (25 mg total) by mouth daily.   rosuvastatin  (CRESTOR ) 5 MG tablet Take 1 tablet (5 mg total) by mouth daily.   Semaglutide , 1 MG/DOSE, 4 MG/3ML SOPN Inject 1 mg into the skin once a week.   Vitamin D , Ergocalciferol , 50000 units CAPS Take 1 capsule by mouth once a week.   [DISCONTINUED] fluconazole  (DIFLUCAN ) 200 MG tablet Take 1  tablet (200 mg total) by mouth once a week.   No facility-administered medications prior to visit.    Review of Systems     Objective    BP 118/66 (BP Location: Left Arm, Patient Position: Sitting, Cuff Size: Normal)   Ht 5' 5 (1.651 m)   Wt 185 lb 9.6 oz (84.2 kg)   LMP 11/21/2018   BMI 30.89 kg/m    Physical Exam Vitals reviewed.  Constitutional:      General: She is not in acute distress.    Appearance: Normal appearance. She is well-developed. She is not diaphoretic.  HENT:     Head: Normocephalic and atraumatic.  Eyes:     General: No scleral icterus.    Conjunctiva/sclera: Conjunctivae normal.  Neck:     Thyroid: No thyromegaly.  Cardiovascular:     Rate and Rhythm: Normal rate and regular rhythm.     Heart sounds: Normal heart sounds. No murmur heard. Pulmonary:     Effort: Pulmonary effort is normal. No respiratory distress.     Breath sounds: Normal breath sounds. No wheezing, rhonchi or rales.  Musculoskeletal:     Cervical back: Neck supple.     Right lower leg: No edema.     Left lower leg: No edema.  Lymphadenopathy:     Cervical: No cervical adenopathy.  Skin:    General: Skin is warm and dry.     Findings: No rash.  Neurological:     Mental Status: She is alert and oriented to person, place, and time. Mental status is at baseline.  Psychiatric:        Mood and Affect: Mood normal.        Behavior: Behavior normal.      No results found for any visits on 03/21/24.  Assessment & Plan     Problem List Items Addressed This Visit       Endocrine   T2DM (type 2 diabetes mellitus) (HCC) - Primary   Blood sugars fluctuate significantly, ranging from the 40s to the 400s. Dexcom app shows 84% time in range, with less than 1% very high and 2% low or very low over the last 90 days. Variability likely due to insulin  resistance and dietary factors. - Continue Ozempic  1 mg weekly - Monitor blood sugar trends using Dexcom app - Evaluate dietary intake  for high glycemic index foods - Consider switching to [employer] if cost-effective; check pharmacy pricing        Other   Status post hysterectomy with oophorectomy   Experiencing low energy levels, possibly related to estrogen deficiency following surgical menopause. Previously declined HRT but now considering due to symptoms. - Initiate Estrace 1 mg daily - Monitor energy levels and symptoms - Reassess HRT efficacy and side effects in November      Other Visit Diagnoses       Drug-induced  constipation         Estrogen deficiency         Allergic contact dermatitis due to adhesives               Allergic contact dermatitis due to Dexcom/overpatch adhesive Experiencing red, itchy rash and bruising under the Dexcom adhesive, likely allergic reaction to the adhesive. 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 Currently using magnesium citrate weekly and a stool softener daily, but bowel movements remain infrequent. Common side effect of GLP-1 receptor agonists. - Initiate daily fiber supplement such as Metamucil or Benefiber - Consider adding Miralax if fiber supplement alone is insufficient - Monitor bowel movement frequency and consistency  General Health Maintenance Mammogram is overdue. Importance of regular mammograms discussed, especially when on HRT. Performs regular self-exams but has not had a mammogram due to logistical challenges. - Schedule mammogram, consider mobile mammography service for convenience - Contact [facility]ert at [facility] for mobile mammography schedule       Return in about 4 months (around 07/22/2024) for as scheduled.       Jon Eva, MD  [facility] [phone] (phone) [phone] (fax)  [facility]

## 2024-03-21 NOTE — Assessment & Plan Note (Signed)
 Experiencing low energy levels, possibly related to estrogen deficiency following surgical menopause. Previously declined HRT but now considering due to symptoms. - Initiate Estrace 1 mg daily - Monitor energy levels and symptoms - Reassess HRT efficacy and side effects in November

## 2024-03-25 ENCOUNTER — Other Ambulatory Visit (HOSPITAL_COMMUNITY): Payer: Self-pay

## 2024-03-26 ENCOUNTER — Other Ambulatory Visit (HOSPITAL_COMMUNITY): Payer: Self-pay

## 2024-03-26 ENCOUNTER — Telehealth: Payer: Self-pay | Admitting: Pharmacist

## 2024-03-26 ENCOUNTER — Other Ambulatory Visit: Payer: Self-pay | Admitting: Pharmacist

## 2024-03-26 ENCOUNTER — Encounter: Payer: Self-pay | Admitting: Pharmacist

## 2024-03-26 ENCOUNTER — Ambulatory Visit: Attending: Family Medicine | Admitting: Pharmacist

## 2024-03-26 DIAGNOSIS — Z7189 Other specified counseling: Secondary | ICD-10-CM

## 2024-03-26 DIAGNOSIS — F332 Major depressive disorder, recurrent severe without psychotic features: Secondary | ICD-10-CM | POA: Diagnosis not present

## 2024-03-26 DIAGNOSIS — F4312 Post-traumatic stress disorder, chronic: Secondary | ICD-10-CM | POA: Diagnosis not present

## 2024-03-26 MED ORDER — TREMFYA PEN 100 MG/ML ~~LOC~~ SOAJ
SUBCUTANEOUS | 1 refills | Status: DC
  Filled 2024-03-27: qty 1, fill #0
  Filled 2024-03-27: qty 1, 28d supply, fill #0
  Filled 2024-04-19 – 2024-04-23 (×3): qty 1, 28d supply, fill #1

## 2024-03-26 MED ORDER — TREMFYA ONE-PRESS 100 MG/ML ~~LOC~~ SOAJ
SUBCUTANEOUS | 2 refills | Status: DC
  Filled 2024-03-27: qty 1, fill #0
  Filled 2024-04-22 – 2024-04-23 (×2): qty 1, 56d supply, fill #0
  Filled 2024-06-11: qty 1, 56d supply, fill #1

## 2024-03-26 NOTE — Telephone Encounter (Signed)
 Called patient to schedule an appointment for the [facility] [employer] [facility]. I was unable to reach the patient so I left a HIPAA-compliant message requesting that the patient return my call.   Butch Penny, PharmD, Patsy [place], CPP Clinical Pharmacist [facility] [phone]

## 2024-03-26 NOTE — Progress Notes (Signed)
 Pharmacy Patient Advocate Encounter   Received notification from Patient Pharmacy that prior authorization for Tremfya  is required/requested.   Insurance verification completed.   The patient is insured through [facility] .   Per test claim: PA required; PA submitted to above mentioned insurance via CoverMyMeds Key/confirmation #/EOC [id number] Status is pending

## 2024-03-26 NOTE — Progress Notes (Signed)
   S: Patient presents today for review of their specialty medication.   Patient is currently taking Tremfya  for plaque psoriasis. Patient is managed by Dr. Cathlyn for this.   Dosing: Psoriasis: SubQ: 100 mg at weeks 0, 4, and then every 8 weeks thereafter.  Adherence: has not started   Efficacy: has not started   Monitoring:  S/sx of infection: none  S/sx of hypersensitivity: none  Current adverse effects: none   O:     Lab Results  Component Value Date   WBC 7.6 11/03/2023   HGB 15.2 11/03/2023   HCT 44.6 11/03/2023   MCV 93 11/03/2023   PLT 370 11/03/2023      Chemistry      Component Value Date/Time   NA 146 (H) 02/05/2024 1540   K 4.8 02/05/2024 1540   CL 106 02/05/2024 1540   CO2 21 02/05/2024 1540   BUN 11 02/05/2024 1540   CREATININE 0.69 02/05/2024 1540      Component Value Date/Time   CALCIUM  9.6 02/05/2024 1540   ALKPHOS 81 02/05/2024 1540   AST 19 02/05/2024 1540   ALT 21 02/05/2024 1540   BILITOT 0.4 02/05/2024 1540       A/P: 1. Medication review: patient currently on Tremfya  for psoriasis. Reviewed the medication with the patient, including the following: Tremfya  is a medication used in the treatment of plaque psoriasis. Administer SubQ into front of thighs, lower abdomen (except for 2 inches around navel), or back of upper arms; do not inject into areas where the skin is tender, bruised, red, hard, thick, scaly, or affected by psoriasis. Possible adverse reactions include increased risk of infection, headache, and hypersensitivity reactions. Live vaccinations should be avoided. No recommendations for any changes.  Herlene Fleeta Morris, PharmD, JAQUELINE, CPP Clinical Pharmacist [facility] [phone]

## 2024-03-26 NOTE — Progress Notes (Signed)
 See OV from 03/26/24 for complete documentation.   Crystal Mccoy, PharmD, JAQUELINE, CPP Clinical Pharmacist [facility] [phone]

## 2024-03-27 ENCOUNTER — Other Ambulatory Visit (HOSPITAL_COMMUNITY): Payer: Self-pay

## 2024-03-27 ENCOUNTER — Encounter (HOSPITAL_COMMUNITY): Payer: Self-pay

## 2024-03-27 ENCOUNTER — Other Ambulatory Visit: Payer: Self-pay

## 2024-03-27 NOTE — Progress Notes (Signed)
 Pharmacy Patient Advocate Encounter  Insurance verification completed.   The patient is insured through [facility]   Ran test claim for Tremfya. Co-pay is $0. Patient has copay card.   This test claim was processed through [facility]- copay amounts may vary at other pharmacies due to pharmacy/plan contracts, or as the patient moves through the different stages of their insurance plan.

## 2024-03-27 NOTE — Progress Notes (Signed)
 Pharmacy Patient Advocate Encounter  Received notification from [facility] that Prior Authorization for Tremfya  has been APPROVED from 03/27/24 to 09/14/24   PA #/Case ID/Reference #: [id number]

## 2024-03-27 NOTE — Progress Notes (Signed)
 Specialty Pharmacy Initial Fill Coordination Note  Crystal Mccoy is a 43 y.o. female contacted today regarding initial fill of specialty medication(s) Guselkumab  (Tremfya  One-Press, Tremfya  Pen)   Patient requested Delivery   Delivery date: 03/28/24   Verified address: [address]   Medication will be filled on 7/9.   Patient is aware of $0 copayment.

## 2024-04-02 DIAGNOSIS — F332 Major depressive disorder, recurrent severe without psychotic features: Secondary | ICD-10-CM | POA: Diagnosis not present

## 2024-04-02 DIAGNOSIS — F4312 Post-traumatic stress disorder, chronic: Secondary | ICD-10-CM | POA: Diagnosis not present

## 2024-04-03 ENCOUNTER — Other Ambulatory Visit (HOSPITAL_COMMUNITY): Payer: Self-pay

## 2024-04-03 ENCOUNTER — Other Ambulatory Visit: Payer: Self-pay | Admitting: Family Medicine

## 2024-04-04 ENCOUNTER — Other Ambulatory Visit: Payer: Self-pay

## 2024-04-04 ENCOUNTER — Other Ambulatory Visit: Payer: Self-pay | Admitting: Family Medicine

## 2024-04-05 ENCOUNTER — Other Ambulatory Visit: Payer: Self-pay

## 2024-04-05 NOTE — Telephone Encounter (Signed)
 Requested medications are due for refill today.  yes  Requested medications are on the active medications list.  yes  Last refill. 11/13/2023 #30 1 rf  Future visit scheduled.   yes  Notes to clinic.  Refill not delegated.    Requested Prescriptions  Pending Prescriptions Disp Refills   ALPRAZolam  (XANAX ) 0.5 MG tablet 30 tablet 1    Sig: Take 1 tablet (0.5 mg total) by mouth 2 (two) times daily as needed for anxiety.     Not Delegated - Psychiatry: Anxiolytics/Hypnotics 2 Failed - 04/05/2024 12:19 PM      Failed - This refill cannot be delegated      Failed - Urine Drug Screen completed in last 360 days      Passed - Patient is not pregnant      Passed - Valid encounter within last 6 months    Recent Outpatient Visits           1 week ago Encounter for medication review and counseling   [place] [facility] [place] Tonia Senior L, RPH-CPP   2 weeks ago Type 2 diabetes mellitus with other specified complication, without long-term current use of insulin  [facility])   [place] [facility] [place], Jon HERO, MD   2 months ago Encounter for annual physical exam   [place] [facility] [place], Jon HERO, MD   5 months ago Type 2 diabetes mellitus without complication, without long-term current use of insulin  [facility])   [place] [facility], Jon HERO, MD

## 2024-04-08 ENCOUNTER — Other Ambulatory Visit: Payer: Self-pay

## 2024-04-08 MED FILL — Alprazolam Tab 0.5 MG: ORAL | 15 days supply | Qty: 30 | Fill #0 | Status: CN

## 2024-04-10 ENCOUNTER — Other Ambulatory Visit: Payer: Self-pay

## 2024-04-16 DIAGNOSIS — F4312 Post-traumatic stress disorder, chronic: Secondary | ICD-10-CM | POA: Diagnosis not present

## 2024-04-16 DIAGNOSIS — F332 Major depressive disorder, recurrent severe without psychotic features: Secondary | ICD-10-CM | POA: Diagnosis not present

## 2024-04-18 ENCOUNTER — Other Ambulatory Visit: Payer: Self-pay

## 2024-04-19 ENCOUNTER — Other Ambulatory Visit: Payer: Self-pay

## 2024-04-19 ENCOUNTER — Other Ambulatory Visit (HOSPITAL_COMMUNITY): Payer: Self-pay

## 2024-04-19 NOTE — Progress Notes (Signed)
 Benefits Investigation Started  Rejection: Prior Authorization Required  Routed to: [employer]

## 2024-04-22 ENCOUNTER — Other Ambulatory Visit: Payer: Self-pay

## 2024-04-23 ENCOUNTER — Other Ambulatory Visit: Payer: Self-pay

## 2024-04-23 ENCOUNTER — Other Ambulatory Visit (HOSPITAL_COMMUNITY): Payer: Self-pay

## 2024-04-23 NOTE — Progress Notes (Signed)
 Specialty Pharmacy Refill Coordination Note  Spoke with Crystal Mccoy is a 43 y.o. female contacted today regarding refills of specialty medication(s) Guselkumab  (Tremfya  One-Press, Tremfya  Pen)  Doses on hand: 0  Injection date: 04/25/24   Patient requested: Delivery   Delivery date: 04/24/24   Verified address: [address] [place] [address]  Medication will be filled on 04/23/24.

## 2024-04-25 ENCOUNTER — Other Ambulatory Visit: Payer: Self-pay

## 2024-04-25 DIAGNOSIS — F332 Major depressive disorder, recurrent severe without psychotic features: Secondary | ICD-10-CM | POA: Diagnosis not present

## 2024-04-25 DIAGNOSIS — F4312 Post-traumatic stress disorder, chronic: Secondary | ICD-10-CM | POA: Diagnosis not present

## 2024-04-26 ENCOUNTER — Other Ambulatory Visit (HOSPITAL_COMMUNITY): Payer: Self-pay

## 2024-04-26 ENCOUNTER — Other Ambulatory Visit: Payer: Self-pay

## 2024-04-29 ENCOUNTER — Other Ambulatory Visit: Payer: Self-pay

## 2024-04-29 MED FILL — Alprazolam Tab 0.5 MG: ORAL | 15 days supply | Qty: 30 | Fill #0 | Status: AC

## 2024-04-30 DIAGNOSIS — F332 Major depressive disorder, recurrent severe without psychotic features: Secondary | ICD-10-CM | POA: Diagnosis not present

## 2024-04-30 DIAGNOSIS — F4312 Post-traumatic stress disorder, chronic: Secondary | ICD-10-CM | POA: Diagnosis not present

## 2024-05-07 DIAGNOSIS — F332 Major depressive disorder, recurrent severe without psychotic features: Secondary | ICD-10-CM | POA: Diagnosis not present

## 2024-05-07 DIAGNOSIS — F4312 Post-traumatic stress disorder, chronic: Secondary | ICD-10-CM | POA: Diagnosis not present

## 2024-05-14 DIAGNOSIS — F4312 Post-traumatic stress disorder, chronic: Secondary | ICD-10-CM | POA: Diagnosis not present

## 2024-05-14 DIAGNOSIS — F332 Major depressive disorder, recurrent severe without psychotic features: Secondary | ICD-10-CM | POA: Diagnosis not present

## 2024-05-21 DIAGNOSIS — F4312 Post-traumatic stress disorder, chronic: Secondary | ICD-10-CM | POA: Diagnosis not present

## 2024-05-21 DIAGNOSIS — F332 Major depressive disorder, recurrent severe without psychotic features: Secondary | ICD-10-CM | POA: Diagnosis not present

## 2024-05-27 ENCOUNTER — Other Ambulatory Visit: Payer: Self-pay

## 2024-05-27 ENCOUNTER — Other Ambulatory Visit: Payer: Self-pay | Admitting: Family Medicine

## 2024-05-27 DIAGNOSIS — E119 Type 2 diabetes mellitus without complications: Secondary | ICD-10-CM

## 2024-05-27 DIAGNOSIS — E7800 Pure hypercholesterolemia, unspecified: Secondary | ICD-10-CM

## 2024-05-27 MED ORDER — ROSUVASTATIN CALCIUM 5 MG PO TABS
5.0000 mg | ORAL_TABLET | Freq: Every day | ORAL | 1 refills | Status: DC
  Filled 2024-05-27: qty 90, 90d supply, fill #0
  Filled 2024-08-29: qty 90, 90d supply, fill #1

## 2024-05-28 DIAGNOSIS — F4312 Post-traumatic stress disorder, chronic: Secondary | ICD-10-CM | POA: Diagnosis not present

## 2024-05-28 DIAGNOSIS — F332 Major depressive disorder, recurrent severe without psychotic features: Secondary | ICD-10-CM | POA: Diagnosis not present

## 2024-06-04 DIAGNOSIS — F332 Major depressive disorder, recurrent severe without psychotic features: Secondary | ICD-10-CM | POA: Diagnosis not present

## 2024-06-04 DIAGNOSIS — F4312 Post-traumatic stress disorder, chronic: Secondary | ICD-10-CM | POA: Diagnosis not present

## 2024-06-10 ENCOUNTER — Other Ambulatory Visit (HOSPITAL_COMMUNITY): Payer: Self-pay

## 2024-06-10 ENCOUNTER — Encounter (INDEPENDENT_AMBULATORY_CARE_PROVIDER_SITE_OTHER): Payer: Self-pay

## 2024-06-11 ENCOUNTER — Other Ambulatory Visit: Payer: Self-pay

## 2024-06-11 DIAGNOSIS — F332 Major depressive disorder, recurrent severe without psychotic features: Secondary | ICD-10-CM | POA: Diagnosis not present

## 2024-06-11 DIAGNOSIS — F4312 Post-traumatic stress disorder, chronic: Secondary | ICD-10-CM | POA: Diagnosis not present

## 2024-06-11 NOTE — Progress Notes (Signed)
 Specialty Pharmacy Refill Coordination Note  HOLLAND KOTTER is a 43 y.o. female contacted today regarding refills of specialty medication(s) Guselkumab  (Tremfya  One-Press, Tremfya  Pen)   Patient requested (Patient-Rptd) Delivery   Delivery date: 06/14/24   Verified address: (Patient-Rptd) [address] [place], [place] [address]   Medication will be filled on 06/13/24.

## 2024-06-12 ENCOUNTER — Other Ambulatory Visit: Payer: Self-pay

## 2024-06-13 DIAGNOSIS — H6692 Otitis media, unspecified, left ear: Secondary | ICD-10-CM | POA: Diagnosis not present

## 2024-06-18 DIAGNOSIS — F332 Major depressive disorder, recurrent severe without psychotic features: Secondary | ICD-10-CM | POA: Diagnosis not present

## 2024-06-18 DIAGNOSIS — F4312 Post-traumatic stress disorder, chronic: Secondary | ICD-10-CM | POA: Diagnosis not present

## 2024-06-28 ENCOUNTER — Other Ambulatory Visit: Payer: Self-pay | Admitting: Family Medicine

## 2024-06-28 ENCOUNTER — Other Ambulatory Visit: Payer: Self-pay

## 2024-06-30 ENCOUNTER — Other Ambulatory Visit: Payer: Self-pay

## 2024-07-01 ENCOUNTER — Other Ambulatory Visit: Payer: Self-pay

## 2024-07-01 MED ORDER — TREMFYA ONE-PRESS 100 MG/ML ~~LOC~~ SOPN
PEN_INJECTOR | SUBCUTANEOUS | 0 refills | Status: DC
  Filled 2024-08-01: qty 1, 56d supply, fill #0

## 2024-07-02 ENCOUNTER — Other Ambulatory Visit: Payer: Self-pay

## 2024-07-02 DIAGNOSIS — F4312 Post-traumatic stress disorder, chronic: Secondary | ICD-10-CM | POA: Diagnosis not present

## 2024-07-02 DIAGNOSIS — F332 Major depressive disorder, recurrent severe without psychotic features: Secondary | ICD-10-CM | POA: Diagnosis not present

## 2024-07-02 MED FILL — Lamotrigine Tab 25 MG: ORAL | 90 days supply | Qty: 90 | Fill #0 | Status: CN

## 2024-07-03 ENCOUNTER — Other Ambulatory Visit: Payer: Self-pay

## 2024-07-09 DIAGNOSIS — F4312 Post-traumatic stress disorder, chronic: Secondary | ICD-10-CM | POA: Diagnosis not present

## 2024-07-09 DIAGNOSIS — F332 Major depressive disorder, recurrent severe without psychotic features: Secondary | ICD-10-CM | POA: Diagnosis not present

## 2024-07-16 DIAGNOSIS — F4312 Post-traumatic stress disorder, chronic: Secondary | ICD-10-CM | POA: Diagnosis not present

## 2024-07-16 DIAGNOSIS — F332 Major depressive disorder, recurrent severe without psychotic features: Secondary | ICD-10-CM | POA: Diagnosis not present

## 2024-07-23 ENCOUNTER — Other Ambulatory Visit: Payer: Self-pay

## 2024-07-23 ENCOUNTER — Ambulatory Visit: Admitting: Family Medicine

## 2024-07-23 ENCOUNTER — Encounter: Payer: Self-pay | Admitting: Family Medicine

## 2024-07-23 VITALS — BP 133/75 | HR 78 | Ht 65.0 in | Wt 182.8 lb

## 2024-07-23 DIAGNOSIS — F4312 Post-traumatic stress disorder, chronic: Secondary | ICD-10-CM | POA: Diagnosis not present

## 2024-07-23 DIAGNOSIS — L0291 Cutaneous abscess, unspecified: Secondary | ICD-10-CM | POA: Diagnosis not present

## 2024-07-23 DIAGNOSIS — F332 Major depressive disorder, recurrent severe without psychotic features: Secondary | ICD-10-CM | POA: Diagnosis not present

## 2024-07-23 MED ORDER — DOXYCYCLINE HYCLATE 100 MG PO TABS
100.0000 mg | ORAL_TABLET | Freq: Two times a day (BID) | ORAL | 0 refills | Status: AC
  Filled 2024-07-23: qty 14, 7d supply, fill #0

## 2024-07-23 MED ORDER — FLUCONAZOLE 150 MG PO TABS
150.0000 mg | ORAL_TABLET | Freq: Once | ORAL | 0 refills | Status: AC
  Filled 2024-07-23: qty 1, 1d supply, fill #0

## 2024-07-23 NOTE — Progress Notes (Signed)
 Acute visit   Patient: Crystal Mccoy   DOB: 1981-05-14   43 y.o. Female  MRN: [id number] PCP: Myrla Jon HERO, MD   Chief Complaint  Patient presents with   Acute Visit    I have a large boil in my groin area. That continues to grow and is very painful.   Cyst    Reports tried warm compress and at some point during shift it popped and now has a foul odor and discharge. Pain at 8/10 when sitting. She has been taking ibuprofen . Reports it was present for a week and was growing in size until today. Hx of boils but not in this area.    Subjective    Discussed the use of AI scribe software for clinical note transcription with the patient, who gave verbal consent to proceed.  History of Present Illness   Crystal Mccoy is a 43 year old female who presents with a painful, draining abscess near her labia.  The abscess began one week ago as a small lesion and has progressively enlarged, becoming swollen and painful. The pain is described as severe due to its location. Today, the abscess started draining with a foul odor. Warm compresses have been applied, aiding in drainage, but there is concern about the adequacy of drainage and potential infection.  No fever is present. Ibuprofen  is used regularly for pain management. She is allergic to penicillin and cephalosporin but can tolerate cefdinir with minor side effects such as a rash.  She works night shifts, which has been challenging due to the discomfort from the abscess. She received a flu shot at the end of September for work requirements.        Review of Systems  Objective    BP 133/75 (BP Location: Left Arm, Patient Position: Sitting, Cuff Size: Large)   Pulse 78   Ht 5' 5 (1.651 m)   Wt 182 lb 12.8 oz (82.9 kg)   LMP 11/21/2018   SpO2 100%   BMI 30.42 kg/m   Physical Exam   Physical Exam   GENITOURINARY: Abscess on left labia with purulent drainage, swelling, and malodorous discharge.     Central area  of induration with peripheral fluctuance   No results found for any visits on 07/23/24.  Assessment & Plan     Problem List Items Addressed This Visit   None Visit Diagnoses       Abscess    -  Primary           Cutaneous abscess of left labia Cutaneous abscess on the left labia, present for approximately one week, initially small and progressively enlarging. Currently draining with foul odor, indicating infection. No fever reported. Painful, especially when sitting. Differential diagnosis includes Bartholin gland cyst, but location and characteristics suggest a cutaneous abscess. The abscess has a squishy part that is draining and a hard knot that may not resolve quickly. Antibiotic treatment is indicated due to infection and incomplete drainage. - Prescribed doxycycline twice daily for one week. - Advised warm compresses to encourage drainage. - Recommended sitz baths if possible, otherwise continue warm compresses. - Advised use of gauze or pad in underwear to manage drainage and odor. - Sent prescription to pharmacy for immediate pickup.       Meds ordered this encounter  Medications   doxycycline (VIBRA-TABS) 100 MG tablet    Sig: Take 1 tablet (100 mg total) by mouth 2 (two) times daily for 7 days.  Dispense:  14 tablet    Refill:  0   fluconazole  (DIFLUCAN ) 150 MG tablet    Sig: Take 1 tablet (150 mg total) by mouth once for 1 dose.    Dispense:  1 tablet    Refill:  0     Return if symptoms worsen or fail to improve.      Jon Eva, MD  [facility] [phone] (phone) [phone] (fax)  [facility]

## 2024-07-29 ENCOUNTER — Other Ambulatory Visit: Payer: Self-pay | Admitting: Family Medicine

## 2024-07-29 ENCOUNTER — Other Ambulatory Visit: Payer: Self-pay

## 2024-07-29 MED FILL — Lamotrigine Tab 25 MG: ORAL | 90 days supply | Qty: 90 | Fill #0 | Status: AC

## 2024-07-30 ENCOUNTER — Other Ambulatory Visit: Payer: Self-pay

## 2024-07-30 DIAGNOSIS — F332 Major depressive disorder, recurrent severe without psychotic features: Secondary | ICD-10-CM | POA: Diagnosis not present

## 2024-07-30 DIAGNOSIS — F4312 Post-traumatic stress disorder, chronic: Secondary | ICD-10-CM | POA: Diagnosis not present

## 2024-07-30 NOTE — Telephone Encounter (Signed)
 Requested medications are due for refill today.  yes  Requested medications are on the active medications list.  yes  Last refill. 03/21/2024 #30 3 rf  Future visit scheduled.   yes  Notes to clinic.  Missing mammogram    Requested Prescriptions  Pending Prescriptions Disp Refills   estradiol  (ESTRACE ) 1 MG tablet 30 tablet 3    Sig: Take 1 tablet (1 mg total) by mouth daily.     OB/GYN:  Estrogens Failed - 07/30/2024  4:00 PM      Failed - Mammogram is up-to-date per Health Maintenance      Passed - Last BP in normal range    BP Readings from Last 1 Encounters:  07/23/24 133/75         Passed - Valid encounter within last 12 months    Recent Outpatient Visits           1 week ago Abscess   [facility] [place], Jon HERO, MD   4 months ago Encounter for medication review and counseling   [place] [facility] [place] Tonia Senior L, RPH-CPP   4 months ago Type 2 diabetes mellitus with other specified complication, without long-term current use of insulin  [facility])   [place] [facility], Jon HERO, MD   5 months ago Encounter for annual physical exam   [place] [facility] [place], Jon HERO, MD   9 months ago Type 2 diabetes mellitus without complication, without long-term current use of insulin  [facility])   [place] [facility], Jon HERO, MD

## 2024-07-31 ENCOUNTER — Other Ambulatory Visit: Payer: Self-pay

## 2024-08-01 ENCOUNTER — Encounter (INDEPENDENT_AMBULATORY_CARE_PROVIDER_SITE_OTHER): Payer: Self-pay

## 2024-08-01 ENCOUNTER — Other Ambulatory Visit: Payer: Self-pay

## 2024-08-01 ENCOUNTER — Other Ambulatory Visit (HOSPITAL_COMMUNITY): Payer: Self-pay

## 2024-08-01 MED FILL — Estradiol Tab 1 MG: ORAL | 30 days supply | Qty: 30 | Fill #0 | Status: CN

## 2024-08-01 NOTE — Progress Notes (Signed)
 Specialty Pharmacy Refill Coordination Note  MyChart Questionnaire Submission  Crystal Mccoy is a 43 y.o. female contacted today regarding refills of specialty medication(s) Tremfya .  Doses on hand: (Patient-Rptd) 0   Injection date: (Patient-Rptd) 08/04/24  Patient requested: (Patient-Rptd) Delivery   Delivery date: 08/02/24  Verified address: [address] [place] [address]  Medication will be filled on 08/01/24.

## 2024-08-06 DIAGNOSIS — F332 Major depressive disorder, recurrent severe without psychotic features: Secondary | ICD-10-CM | POA: Diagnosis not present

## 2024-08-06 DIAGNOSIS — F4312 Post-traumatic stress disorder, chronic: Secondary | ICD-10-CM | POA: Diagnosis not present

## 2024-08-08 ENCOUNTER — Ambulatory Visit: Admitting: Family Medicine

## 2024-08-09 ENCOUNTER — Other Ambulatory Visit (HOSPITAL_COMMUNITY): Payer: Self-pay

## 2024-08-12 ENCOUNTER — Other Ambulatory Visit: Payer: Self-pay

## 2024-08-13 DIAGNOSIS — F332 Major depressive disorder, recurrent severe without psychotic features: Secondary | ICD-10-CM | POA: Diagnosis not present

## 2024-08-13 DIAGNOSIS — F4312 Post-traumatic stress disorder, chronic: Secondary | ICD-10-CM | POA: Diagnosis not present

## 2024-08-20 DIAGNOSIS — F332 Major depressive disorder, recurrent severe without psychotic features: Secondary | ICD-10-CM | POA: Diagnosis not present

## 2024-08-20 DIAGNOSIS — F4312 Post-traumatic stress disorder, chronic: Secondary | ICD-10-CM | POA: Diagnosis not present

## 2024-08-29 ENCOUNTER — Telehealth: Payer: Self-pay

## 2024-08-29 ENCOUNTER — Other Ambulatory Visit: Payer: Self-pay | Admitting: Family Medicine

## 2024-08-29 ENCOUNTER — Other Ambulatory Visit: Payer: Self-pay

## 2024-08-29 DIAGNOSIS — E1169 Type 2 diabetes mellitus with other specified complication: Secondary | ICD-10-CM

## 2024-08-29 MED FILL — Estradiol Tab 1 MG: ORAL | 30 days supply | Qty: 30 | Fill #0 | Status: AC

## 2024-08-29 NOTE — Telephone Encounter (Addendum)
 Pharmacy Patient Advocate Encounter   Received notification from Patient Pharmacy that prior authorization for Tremfya  is required/requested.   Insurance verification completed.   The patient is insured through [facility].   Per test claim: PA required; PA submitted to above mentioned insurance via Latent Key/confirmation #/EOC [id number] Status is pending

## 2024-08-30 ENCOUNTER — Other Ambulatory Visit: Payer: Self-pay

## 2024-08-30 MED FILL — Semaglutide Soln Pen-inj 1 MG/DOSE (4 MG/3ML): SUBCUTANEOUS | 84 days supply | Qty: 9 | Fill #0 | Status: AC

## 2024-08-31 ENCOUNTER — Other Ambulatory Visit: Payer: Self-pay

## 2024-09-02 ENCOUNTER — Other Ambulatory Visit: Payer: Self-pay

## 2024-09-04 NOTE — Telephone Encounter (Signed)
 Pharmacy Patient Advocate Encounter  Received notification from [facility] that Prior Authorization for Tremfya  has been APPROVED from 09/04/24 to 09/03/25   PA #/Case ID/Reference #: [id number]

## 2024-09-16 ENCOUNTER — Other Ambulatory Visit: Payer: Self-pay

## 2024-09-17 ENCOUNTER — Ambulatory Visit: Admitting: Family Medicine

## 2024-09-17 ENCOUNTER — Other Ambulatory Visit: Payer: Self-pay

## 2024-09-18 ENCOUNTER — Other Ambulatory Visit: Payer: Self-pay

## 2024-09-20 ENCOUNTER — Other Ambulatory Visit: Payer: Self-pay

## 2024-09-20 NOTE — Progress Notes (Signed)
 Specialty Pharmacy Refill Coordination Note  Crystal Mccoy is a 44 y.o. female contacted today regarding refills of specialty medication(s) Guselkumab  (Tremfya  One-Press)   Patient requested Delivery   Delivery date: 10/01/24   Verified address: [address] [place] [address]   Medication will be filled on: 09/30/24  This fill date is pending response to refill request from provider. Patient is aware and if they have not received fill by intended date they must follow up with pharmacy.

## 2024-10-10 ENCOUNTER — Other Ambulatory Visit: Payer: Self-pay

## 2024-10-14 ENCOUNTER — Other Ambulatory Visit: Payer: Self-pay

## 2024-10-14 MED ORDER — SKYRIZI PEN 150 MG/ML ~~LOC~~ SOAJ
SUBCUTANEOUS | 1 refills | Status: DC
  Filled 2024-10-14: qty 1, 28d supply, fill #0

## 2024-10-17 ENCOUNTER — Other Ambulatory Visit: Payer: Self-pay

## 2024-10-17 NOTE — Progress Notes (Signed)
 Patient being switched to Skyrizi    Pharmacy Patient Advocate Encounter  Insurance verification completed.   The patient is insured through [facility]   Ran test claim for Skyrizi . Co-pay is $250.  This test claim was processed through [facility]- copay amounts may vary at other pharmacies due to pharmacy/plan contracts, or as the patient moves through the different stages of their insurance plan.

## 2024-10-18 ENCOUNTER — Other Ambulatory Visit: Payer: Self-pay

## 2024-10-18 ENCOUNTER — Telehealth: Payer: Self-pay | Admitting: Pharmacist

## 2024-10-18 NOTE — Telephone Encounter (Signed)
 Called patient to schedule an appointment for the [facility] Specialty Medication [facility]. I was unable to reach the patient so I left a HIPAA-compliant message requesting that the patient return my call.   Crystal Mccoy, PharmD, JAQUELINE, CPP Clinical Pharmacist [facility] [phone]

## 2024-10-21 ENCOUNTER — Other Ambulatory Visit: Payer: Self-pay

## 2024-10-22 ENCOUNTER — Other Ambulatory Visit: Payer: Self-pay

## 2024-10-22 ENCOUNTER — Ambulatory Visit: Admitting: Pharmacist

## 2024-10-22 ENCOUNTER — Other Ambulatory Visit: Payer: Self-pay | Admitting: Pharmacist

## 2024-10-22 ENCOUNTER — Encounter: Payer: Self-pay | Admitting: Pharmacist

## 2024-10-22 DIAGNOSIS — Z7189 Other specified counseling: Secondary | ICD-10-CM

## 2024-10-22 MED ORDER — SKYRIZI PEN 150 MG/ML ~~LOC~~ SOAJ
SUBCUTANEOUS | 1 refills | Status: AC
  Filled 2024-10-22: qty 1, 28d supply, fill #0

## 2024-10-22 NOTE — Progress Notes (Signed)
" ° °  S: Patient presents for review of their specialty medication therapy.  Patient is currently taking Skyrizi  for psoriasis. Patient is managed by Dr. Cathlyn for this.   Adherence: confirmed  Efficacy: has not been enough time   Dosing: Plaque psoriasis, moderate to severe: SubQ: Two consecutive injections (75 mg each) for a total dose of 150 mg at weeks 0, 4, and then every 12 weeks thereafter.  Screening: TB test: completed   Monitoring: S/sx of infection: none S/sx of hypersensitivity/injection site reaction: none  O:    Lab Results  Component Value Date   WBC 7.6 11/03/2023   HGB 15.2 11/03/2023   HCT 44.6 11/03/2023   MCV 93 11/03/2023   PLT 370 11/03/2023     Chemistry      Component Value Date/Time   NA 146 (H) 02/05/2024 1540   K 4.8 02/05/2024 1540   CL 106 02/05/2024 1540   CO2 21 02/05/2024 1540   BUN 11 02/05/2024 1540   CREATININE 0.69 02/05/2024 1540      Component Value Date/Time   CALCIUM  9.6 02/05/2024 1540   ALKPHOS 81 02/05/2024 1540   AST 19 02/05/2024 1540   ALT 21 02/05/2024 1540   BILITOT 0.4 02/05/2024 1540     A/P: 1. Medication review: Patient currently on Skyrizi  for psoriasis. Reviewed the medication with the patient, including the following: Skyrizi  is a monoclonal antibody used in the treatment of psoriasis. Patient educated on purpose, proper use and potential adverse effects of Skyrizi . Possible adverse effects are infections, headache, and injection site reactions. Live vaccinations should be avoided while on therapy. SubQ: Administer the two consecutive injections subcutaneously at different anatomic locations, such as thighs, abdomen, or back of upper arms. Do not inject into areas where the skin is tender, bruised, red, hard, or affected by psoriasis. Intended for use under supervision of a health care professional; self-injection may occur after proper training (except back of upper arms). No recommendations for any changes  at this time.  Herlene Fleeta Morris, PharmD, JAQUELINE, CPP Clinical Pharmacist [facility] [phone]         "

## 2024-10-24 ENCOUNTER — Other Ambulatory Visit: Payer: Self-pay

## 2024-10-24 ENCOUNTER — Other Ambulatory Visit (HOSPITAL_COMMUNITY): Payer: Self-pay

## 2024-10-24 ENCOUNTER — Encounter: Payer: Self-pay | Admitting: Family Medicine

## 2024-10-24 ENCOUNTER — Ambulatory Visit: Admitting: Family Medicine

## 2024-10-24 VITALS — BP 122/66 | HR 65 | Resp 14 | Ht 65.0 in | Wt 183.1 lb

## 2024-10-24 DIAGNOSIS — E1169 Type 2 diabetes mellitus with other specified complication: Secondary | ICD-10-CM

## 2024-10-24 DIAGNOSIS — Z1231 Encounter for screening mammogram for malignant neoplasm of breast: Secondary | ICD-10-CM

## 2024-10-24 DIAGNOSIS — E559 Vitamin D deficiency, unspecified: Secondary | ICD-10-CM | POA: Insufficient documentation

## 2024-10-24 DIAGNOSIS — F411 Generalized anxiety disorder: Secondary | ICD-10-CM

## 2024-10-24 DIAGNOSIS — F3341 Major depressive disorder, recurrent, in partial remission: Secondary | ICD-10-CM

## 2024-10-24 DIAGNOSIS — F4310 Post-traumatic stress disorder, unspecified: Secondary | ICD-10-CM

## 2024-10-24 DIAGNOSIS — E8940 Asymptomatic postprocedural ovarian failure: Secondary | ICD-10-CM | POA: Insufficient documentation

## 2024-10-24 MED ORDER — ROSUVASTATIN CALCIUM 5 MG PO TABS
5.0000 mg | ORAL_TABLET | Freq: Every day | ORAL | 1 refills | Status: AC
  Filled 2024-10-24: qty 90, 90d supply, fill #0

## 2024-10-24 MED ORDER — LAMOTRIGINE 25 MG PO TABS
50.0000 mg | ORAL_TABLET | Freq: Every day | ORAL | 1 refills | Status: AC
  Filled 2024-10-24: qty 180, 90d supply, fill #0

## 2024-10-24 MED ORDER — SEMAGLUTIDE (2 MG/DOSE) 8 MG/3ML ~~LOC~~ SOPN
2.0000 mg | PEN_INJECTOR | SUBCUTANEOUS | 5 refills | Status: AC
  Filled 2024-10-24: qty 3, fill #0
  Filled 2024-10-24: qty 3, 28d supply, fill #0

## 2024-10-24 MED ORDER — ALPRAZOLAM 0.5 MG PO TABS
0.5000 mg | ORAL_TABLET | Freq: Two times a day (BID) | ORAL | 1 refills | Status: AC | PRN
  Filled 2024-10-24: qty 30, 15d supply, fill #0

## 2024-10-24 MED ORDER — ESTRADIOL 1 MG PO TABS
1.0000 mg | ORAL_TABLET | Freq: Every day | ORAL | 3 refills | Status: AC
  Filled 2024-10-24: qty 90, 90d supply, fill #0

## 2024-10-24 NOTE — Assessment & Plan Note (Signed)
 Vitamin D  levels need to be checked as it has been a while since the last test. - Ordered vitamin D  level test.

## 2024-10-24 NOTE — Assessment & Plan Note (Signed)
 Managed with Lamictal  and Xanax  0.5 mg BID PRN. She reports infrequent use but prefers to have it available for night terrors. - Continue Xanax  0.5 mg BID PRN.

## 2024-10-24 NOTE — Assessment & Plan Note (Signed)
 Managed with daily estradiol . She reports no significant changes in energy levels but believes it is beneficial for bone health. - Continue estradiol  daily.

## 2024-10-24 NOTE — Patient Instructions (Signed)
 Call [facility] to schedule a mammogram [phone]

## 2024-10-24 NOTE — Assessment & Plan Note (Signed)
 Major depressive disorder is managed with Lamictal . She self-adjusted the dose from 25 mg to 50 mg, reporting improvement in mood stabilization. - Increased Lamictal  to 50 mg daily.

## 2024-10-24 NOTE — Progress Notes (Signed)
 "     Established patient visit   Patient: Crystal Mccoy   DOB: [birth year]   44 y.o. Female  MRN: [id number] Visit Date: 10/24/2024  Today's healthcare provider: Jon Eva, MD   Chief Complaint  Patient presents with   Medical Management of Chronic Issues    6 month follow up No eye exam done yet   Subjective    HPI HPI     Medical Management of Chronic Issues    Additional comments: 6 month follow up No eye exam done yet      Last edited by Wilfred Hargis RAMAN, CMA on 10/24/2024  2:41 PM.       Discussed the use of AI scribe software for clinical note transcription with the patient, who gave verbal consent to proceed.  History of Present Illness   Crystal Mccoy is a 44 year old female with type 2 diabetes mellitus, hyperlipidemia, generalized anxiety disorder, major depressive disorder, and PTSD who presents for chronic follow-up.  She is taking Ozempic  1 mg weekly with last A1c 5.4. She is very physically active but reports that her weight loss has stalled.  For hyperlipidemia, she takes Crestor  5 mg daily and is compliant.  For mood, she takes Lamictal , increased from 25 mg to 50 mg daily two months ago, with significant improvement in mood stability. She has Xanax  0.5 mg BID PRN, which she uses sparingly, mainly for PTSD-related night terrors. She denies urinary symptoms despite recent sexual activity.  She is status post oophorectomy and hysterectomy with surgical menopause and takes daily estradiol  for hormone replacement, which she feels helps her bone health. 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  She asks to recheck vitamin D . She is sexually active again and reports recurrent yeast infections and uses prior prescribed medications for treatment.       Medications: Show/hide medication list[1]  Review of Systems     Objective    BP 122/66   Pulse 65   Resp 14   Ht 5' 5  (1.651 m)   Wt 183 lb 1.6 oz (83.1 kg)   LMP 11/21/2018   SpO2 100%   BMI 30.47 kg/m    Physical Exam Vitals reviewed.  Constitutional:      General: She is not in acute distress.    Appearance: Normal appearance. She is well-developed. She is not diaphoretic.  HENT:     Head: Normocephalic and atraumatic.  Eyes:     General: No scleral icterus.    Conjunctiva/sclera: Conjunctivae normal.  Neck:     Thyroid: No thyromegaly.  Cardiovascular:     Rate and Rhythm: Normal rate and regular rhythm.     Heart sounds: Normal heart sounds. No murmur heard. Pulmonary:     Effort: Pulmonary effort is normal. No respiratory distress.     Breath sounds: Normal breath sounds. No wheezing, rhonchi or rales.  Musculoskeletal:     Cervical back: Neck supple.     Right lower leg: No edema.     Left lower leg: No edema.  Lymphadenopathy:     Cervical: No cervical adenopathy.  Skin:    General: Skin is warm and dry.     Findings: No rash.  Neurological:     Mental Status: She is alert and oriented to person, place, and time. Mental status is at baseline.  Psychiatric:        Mood and  Affect: Mood normal.        Behavior: Behavior normal.      No results found for any visits on 10/24/24.  Assessment & Plan     Problem List Items Addressed This Visit       Endocrine   Type 2 diabetes mellitus with other specified complication (HCC) - Primary   Type 2 diabetes mellitus with hyperlipidemia Type 2 diabetes mellitus is well-controlled with an A1c of 5.4. Hyperlipidemia is managed with rosuvastatin . She reports a plateau in weight loss despite increased physical activity. - Increased Ozempic  to 2 mg weekly for diabetes management and potential weight loss benefits. - Continue rosuvastatin  5 mg daily. - Ordered A1c, cholesterol, kidney, and liver function tests.      Relevant Medications   rosuvastatin  (CRESTOR ) 5 MG tablet   Semaglutide , 2 MG/DOSE, 8 MG/3ML SOPN   Other Relevant  Orders   Hemoglobin A1c   Urine Microalbumin w/creat. ratio   Hyperlipidemia associated with type 2 diabetes mellitus (HCC)   Type 2 diabetes mellitus with hyperlipidemia Type 2 diabetes mellitus is well-controlled with an A1c of 5.4. Hyperlipidemia is managed with rosuvastatin . She reports a plateau in weight loss despite increased physical activity. - Increased Ozempic  to 2 mg weekly for diabetes management and potential weight loss benefits. - Continue rosuvastatin  5 mg daily. - Ordered A1c, cholesterol, kidney, and liver function tests.      Relevant Medications   rosuvastatin  (CRESTOR ) 5 MG tablet   Semaglutide , 2 MG/DOSE, 8 MG/3ML SOPN   Other Relevant Orders   Lipid Panel With LDL/HDL Ratio   Comprehensive metabolic panel with GFR     Other   PTSD (post-traumatic stress disorder)   Managed with Xanax  as needed for night terrors. - Continue current management with Xanax  as needed.      Relevant Medications   ALPRAZolam  (XANAX ) 0.5 MG tablet   GAD (generalized anxiety disorder)   Managed with Lamictal  and Xanax  0.5 mg BID PRN. She reports infrequent use but prefers to have it available for night terrors. - Continue Xanax  0.5 mg BID PRN.      Relevant Medications   ALPRAZolam  (XANAX ) 0.5 MG tablet   MDD (major depressive disorder)   Major depressive disorder is managed with Lamictal . She self-adjusted the dose from 25 mg to 50 mg, reporting improvement in mood stabilization. - Increased Lamictal  to 50 mg daily.      Relevant Medications   ALPRAZolam  (XANAX ) 0.5 MG tablet   Avitaminosis D   Vitamin D  levels need to be checked as it has been a while since the last test. - Ordered vitamin D  level test.      Relevant Orders   VITAMIN D  25 Hydroxy (Vit-D Deficiency, Fractures)   Surgical menopause   Managed with daily estradiol . She reports no significant changes in energy levels but believes it is beneficial for bone health. - Continue estradiol  daily.      Other  Visit Diagnoses       Breast cancer screening by mammogram       Relevant Orders   MM 3D SCREENING MAMMOGRAM BILATERAL BREAST           General Health Maintenance She is due for a mammogram and has not been seeing a GYN regularly due to surgical menopause. - Ordered mammogram and provided contact information for scheduling. - Scheduled six-month physical exam.        Return in about 6 months (around 04/23/2025) for CPE.  Jon Eva, MD  [facility] [phone] (phone) [phone] (fax)  [facility]     [1]  Outpatient Medications Prior to Visit  Medication Sig   Continuous Glucose Sensor (DEXCOM G7 SENSOR) MISC Use to monitor glucose continuously. Change every 10 days.   fluticasone  (FLONASE ) 50 MCG/ACT nasal spray Place 2 sprays into both nostrils daily.   risankizumab -rzaa (SKYRIZI  PEN) 150 MG/ML pen Loading dose: 1(one) injection subcutaneous at week 0 and then at week 4   Vitamin D , Ergocalciferol , 50000 units CAPS Take 1 capsule by mouth once a week.   [DISCONTINUED] ALPRAZolam  (XANAX ) 0.5 MG tablet Take 1 tablet (0.5 mg total) by mouth 2 (two) times daily as needed for anxiety.   [DISCONTINUED] estradiol  (ESTRACE ) 1 MG tablet Take 1 tablet (1 mg total) by mouth daily.   [DISCONTINUED] lamoTRIgine  (LAMICTAL ) 25 MG tablet Take 1 tablet (25 mg total) by mouth daily.   [DISCONTINUED] rosuvastatin  (CRESTOR ) 5 MG tablet Take 1 tablet (5 mg total) by mouth daily.   [DISCONTINUED] Semaglutide , 1 MG/DOSE, (OZEMPIC , 1 MG/DOSE,) 4 MG/3ML SOPN Inject 1 mg into the skin once a week.   [DISCONTINUED] Clobetasol  Propionate Emulsion 0.05 % topical foam Apply 1 Application topically 2 (two) times daily.   [DISCONTINUED] fluconazole  (DIFLUCAN ) 200 MG tablet Take 200 mg by mouth once a week.   No facility-administered medications prior to visit.   "

## 2024-10-24 NOTE — Assessment & Plan Note (Signed)
 Type 2 diabetes mellitus with hyperlipidemia Type 2 diabetes mellitus is well-controlled with an A1c of 5.4. Hyperlipidemia is managed with rosuvastatin . She reports a plateau in weight loss despite increased physical activity. - Increased Ozempic  to 2 mg weekly for diabetes management and potential weight loss benefits. - Continue rosuvastatin  5 mg daily. - Ordered A1c, cholesterol, kidney, and liver function tests.

## 2024-10-24 NOTE — Assessment & Plan Note (Signed)
 Managed with Xanax  as needed for night terrors. - Continue current management with Xanax  as needed.

## 2024-10-25 ENCOUNTER — Other Ambulatory Visit: Payer: Self-pay

## 2024-10-25 LAB — HEMOGLOBIN A1C
Est. average glucose Bld gHb Est-mCnc: 108 mg/dL
Hgb A1c MFr Bld: 5.4 % (ref 4.8–5.6)

## 2024-10-25 LAB — COMPREHENSIVE METABOLIC PANEL WITH GFR
ALT: 12 [IU]/L (ref 0–32)
AST: 14 [IU]/L (ref 0–40)
Albumin: 4.4 g/dL (ref 3.9–4.9)
Alkaline Phosphatase: 56 [IU]/L (ref 41–116)
BUN/Creatinine Ratio: 14 (ref 9–23)
BUN: 9 mg/dL (ref 6–24)
Bilirubin Total: 0.7 mg/dL (ref 0.0–1.2)
CO2: 23 mmol/L (ref 20–29)
Calcium: 9.3 mg/dL (ref 8.7–10.2)
Chloride: 102 mmol/L (ref 96–106)
Creatinine, Ser: 0.64 mg/dL (ref 0.57–1.00)
Globulin, Total: 2.1 g/dL (ref 1.5–4.5)
Glucose: 116 mg/dL — ABNORMAL HIGH (ref 70–99)
Potassium: 3.6 mmol/L (ref 3.5–5.2)
Sodium: 140 mmol/L (ref 134–144)
Total Protein: 6.5 g/dL (ref 6.0–8.5)
eGFR: 112 mL/min/{1.73_m2}

## 2024-10-25 LAB — LIPID PANEL WITH LDL/HDL RATIO
Cholesterol, Total: 109 mg/dL (ref 100–199)
HDL: 57 mg/dL
LDL Chol Calc (NIH): 36 mg/dL (ref 0–99)
LDL/HDL Ratio: 0.6 ratio (ref 0.0–3.2)
Triglycerides: 78 mg/dL (ref 0–149)
VLDL Cholesterol Cal: 16 mg/dL (ref 5–40)

## 2024-10-25 LAB — MICROALBUMIN / CREATININE URINE RATIO
Creatinine, Urine: 154.8 mg/dL
Microalb/Creat Ratio: 4 mg/g{creat} (ref 0–29)
Microalbumin, Urine: 6.7 ug/mL

## 2024-10-25 LAB — VITAMIN D 25 HYDROXY (VIT D DEFICIENCY, FRACTURES): Vit D, 25-Hydroxy: 29.4 ng/mL — ABNORMAL LOW (ref 30.0–100.0)

## 2025-05-01 ENCOUNTER — Encounter: Admitting: Family Medicine
